# Patient Record
Sex: Female | Born: 2000 | Race: White | Hispanic: No | Marital: Single | Smoking: Never smoker
Health system: Southern US, Community
[De-identification: ages and names within clinical notes are randomized; demographics above are authoritative.]

## PROBLEM LIST (undated history)

## (undated) ENCOUNTER — Inpatient Hospital Stay (HOSPITAL_COMMUNITY): Payer: Self-pay

## (undated) DIAGNOSIS — F329 Major depressive disorder, single episode, unspecified: Secondary | ICD-10-CM

## (undated) DIAGNOSIS — F419 Anxiety disorder, unspecified: Secondary | ICD-10-CM

## (undated) DIAGNOSIS — F909 Attention-deficit hyperactivity disorder, unspecified type: Secondary | ICD-10-CM

## (undated) DIAGNOSIS — J302 Other seasonal allergic rhinitis: Secondary | ICD-10-CM

## (undated) DIAGNOSIS — R519 Headache, unspecified: Secondary | ICD-10-CM

## (undated) DIAGNOSIS — F32A Depression, unspecified: Secondary | ICD-10-CM

## (undated) DIAGNOSIS — R51 Headache: Secondary | ICD-10-CM

## (undated) DIAGNOSIS — A749 Chlamydial infection, unspecified: Secondary | ICD-10-CM

## (undated) HISTORY — DX: Anxiety disorder, unspecified: F41.9

## (undated) HISTORY — DX: Headache: R51

## (undated) HISTORY — DX: Headache, unspecified: R51.9

## (undated) HISTORY — DX: Attention-deficit hyperactivity disorder, unspecified type: F90.9

## (undated) HISTORY — DX: Other seasonal allergic rhinitis: J30.2

## (undated) HISTORY — DX: Depression, unspecified: F32.A

---
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
## 2022-01-05 DIAGNOSIS — Z419 Encounter for procedure for purposes other than remedying health state, unspecified: Secondary | ICD-10-CM | POA: Diagnosis not present

## 2022-01-09 ENCOUNTER — Ambulatory Visit
Admission: EM | Admit: 2022-01-09 | Discharge: 2022-01-09 | Disposition: A | Discharge status: Home / Self Care | Payer: Medicaid Other | Attending: Emergency Medicine | Admitting: Emergency Medicine

## 2022-01-09 ENCOUNTER — Encounter: Payer: Self-pay | Admitting: Emergency Medicine

## 2022-01-09 DIAGNOSIS — Z202 Contact with and (suspected) exposure to infections with a predominantly sexual mode of transmission: Secondary | ICD-10-CM | POA: Diagnosis not present

## 2022-01-09 DIAGNOSIS — Z113 Encounter for screening for infections with a predominantly sexual mode of transmission: Secondary | ICD-10-CM

## 2022-01-09 DIAGNOSIS — N898 Other specified noninflammatory disorders of vagina: Secondary | ICD-10-CM

## 2022-01-09 LAB — POCT URINALYSIS DIP (MANUAL ENTRY)
Bilirubin, UA: NEGATIVE
Glucose, UA: NEGATIVE mg/dL
Ketones, POC UA: NEGATIVE mg/dL
Nitrite, UA: NEGATIVE
Protein Ur, POC: NEGATIVE mg/dL
Spec Grav, UA: 1.02 (ref 1.010–1.025)
Urobilinogen, UA: 0.2 E.U./dL
pH, UA: 6 (ref 5.0–8.0)

## 2022-01-09 LAB — POCT URINE PREGNANCY: Preg Test, Ur: NEGATIVE

## 2022-01-09 MED ORDER — DOXYCYCLINE HYCLATE 100 MG PO CAPS: 100.0000 mg | ORAL_CAPSULE | Freq: Two times a day (BID) | ORAL | 0 refills | Status: AC

## 2022-01-09 NOTE — ED Provider Notes (Signed)
?[facility] ? ? ? ?CSN: [id number] ?Arrival date & time: 01/09/22  1423 ? ? ?  ? ?History   ?Chief Complaint ?Chief Complaint  ?Patient presents with  ? Exposure to STD  ?  Entered by patient  ? ? ?HPI ?Sarah Spears is a 21 y.o. female.  Patient presents with white vaginal discharge x2 days.  She states her boyfriend tested positive for chlamydia last night.  She denies fever, rash, abdominal pain, dysuria, flank pain, pelvic pain, or other symptoms.  No treatments at home.  Her medical history includes seasonal allergies, anxiety, depression, ADHD, bipolar 2 disorder. ? ?The history is provided by the patient and medical records.  ? ?Past Medical History:  ?Diagnosis Date  ? ADHD (attention deficit hyperactivity disorder)   ? Anxiety   ? COVID-19 2022  ? Depression   ? Headache   ? Seasonal allergies   ? ? ?Patient Active Problem List  ? Diagnosis Date Noted  ? Parent-child conflict 11/27/2019  ? Bipolar 2 disorder, major depressive episode (HCC) 10/30/2019  ? Overdose 11/29/2017  ? Severe recurrent major depression without psychotic features (HCC) 11/28/2017  ? ? ?Past Surgical History:  ?Procedure Laterality Date  ? REMOVAL OF IMPLANON ROD  07/08/2021  ? ? ?OB History   ?No obstetric history on file. ?  ? ? ? ?Home Medications   ? ?Prior to Admission medications   ?Medication Sig Start Date End Date Taking? Authorizing Provider  ?doxycycline (VIBRAMYCIN) 100 MG capsule Take 1 capsule (100 mg total) by mouth 2 (two) times daily for 7 days. 01/09/22 01/16/22 Yes Mickie Bail, NP  ?escitalopram (LEXAPRO) 10 MG tablet Take 1 tablet (10 mg total) by mouth daily. 11/21/20   Jesse Sans, MD  ?etonogestrel (NEXPLANON) 68 MG IMPL implant 1 each by Subdermal route once. 08/18/21   [provider]  ?hydrOXYzine (ATARAX/VISTARIL) 25 MG tablet Take 1 tablet (25 mg total) by mouth 3 (three) times daily as needed for anxiety. 11/20/20   Jesse Sans, MD  ?traZODone (DESYREL) 50 MG tablet Take 0.5  tablets (25 mg total) by mouth at bedtime as needed for sleep. 11/20/20   Jesse Sans, MD  ? ? ?Family History ?Family History  ?Problem Relation Age of Onset  ? Bipolar disorder Mother   ? Anxiety disorder Mother   ? Drug abuse Mother   ? Depression Mother   ? ADD / ADHD Mother   ? Seizures Sister   ? Bipolar disorder Maternal Aunt   ? Anxiety disorder Maternal Aunt   ? Depression Maternal Aunt   ? Alcohol abuse Maternal Aunt   ? Drug abuse Maternal Aunt   ? Post-traumatic stress disorder Maternal Aunt   ? Bipolar disorder Maternal Grandfather   ? Anxiety disorder Maternal Grandfather   ? Depression Maternal Grandfather   ? Post-traumatic stress disorder Maternal Grandmother   ? Depression Maternal Grandmother   ? Bipolar disorder Maternal Grandmother   ? Anxiety disorder Maternal Grandmother   ? ? ?Social History ?Social History  ? ?Tobacco Use  ? Smoking status: Never  ? Smokeless tobacco: Never  ?Vaping Use  ? Vaping Use: Never used  ?Substance Use Topics  ? Alcohol use: No  ? Drug use: No  ? ? ? ?Allergies   ?Lactose intolerance (gi) ? ? ?Review of Systems ?Review of Systems  ?Constitutional:  Negative for chills and fever.  ?Gastrointestinal:  Negative for abdominal pain.  ?Genitourinary:  Positive for vaginal discharge.  Negative for dysuria, flank pain, hematuria and pelvic pain.  ?Skin:  Negative for rash and wound.  ?All other systems reviewed and are negative. ? ? ?Physical Exam ?Triage Vital Signs ?ED Triage Vitals  ?Enc Vitals Group  ?   BP   ?   Pulse   ?   Resp   ?   Temp   ?   Temp src   ?   SpO2   ?   Weight   ?   Height   ?   Head Circumference   ?   Peak Flow   ?   Pain Score   ?   Pain Loc   ?   Pain Edu?   ?   Excl. in GC?   ? ?No data found. ? ?Updated Vital Signs ?BP 127/68   Pulse 95   Temp 98.2 ?F (36.8 ?C)   Resp 18   SpO2 96%  ? ?Visual Acuity ?Right Eye Distance:   ?Left Eye Distance:   ?Bilateral Distance:   ? ?Right Eye Near:   ?Left Eye Near:    ?Bilateral Near:    ? ?Physical  Exam ?Vitals and nursing note reviewed.  ?Constitutional:   ?   General: She is not in acute distress. ?   Appearance: Normal appearance. She is well-developed. She is not ill-appearing.  ?HENT:  ?   Mouth/Throat:  ?   Mouth: Mucous membranes are moist.  ?Cardiovascular:  ?   Rate and Rhythm: Normal rate and regular rhythm.  ?   Heart sounds: Normal heart sounds.  ?Pulmonary:  ?   Effort: Pulmonary effort is normal. No respiratory distress.  ?   Breath sounds: Normal breath sounds.  ?Abdominal:  ?   General: Bowel sounds are normal.  ?   Palpations: Abdomen is soft.  ?   Tenderness: There is no abdominal tenderness. There is no right CVA tenderness, left CVA tenderness, guarding or rebound.  ?Musculoskeletal:  ?   Cervical back: Neck supple.  ?Skin: ?   General: Skin is warm and dry.  ?Neurological:  ?   Mental Status: She is alert.  ?Psychiatric:     ?   Mood and Affect: Mood normal.     ?   Behavior: Behavior normal.  ? ? ? ?UC Treatments / Results  ?Labs ?(all labs ordered are listed, but only abnormal results are displayed) ?Labs Reviewed  ?POCT URINALYSIS DIP (MANUAL ENTRY) - Abnormal; Notable for the following components:  ?    Result Value  ? Blood, UA trace-lysed (*)   ? Leukocytes, UA Small (1+) (*)   ? All other components within normal limits  ?POCT URINE PREGNANCY  ?CERVICOVAGINAL ANCILLARY ONLY  ? ? ?EKG ? ? ?Radiology ?No results found. ? ?Procedures ?Procedures (including critical care time) ? ?Medications Ordered in UC ?Medications - No data to display ? ?Initial Impression / Assessment and Plan / UC Course  ?I have reviewed the triage vital signs and the nursing notes. ? ?Pertinent labs & imaging results that were available during my care of the patient were reviewed by me and considered in my medical decision making (see chart for details). ? ?  ?Exposure to chlamydia, vaginal discharge, screening for STDs.  Patient's boyfriend tested positive for chlamydia yesterday.  Patient obtained vaginal self  swab for testing.  Treating with doxycycline.  Discussed that we will call if test results are positive.  Discussed that she may require additional treatment at that time.  Discussed that sexual partner(s) may also require treatment.  Instructed patient to abstain from sexual activity for at least 7 days.  Instructed her to follow-up with her PCP or gynecologist if her symptoms are not improving.  Patient agrees to plan of care.  ? ?Final Clinical Impressions(s) / UC Diagnoses  ? ?Final diagnoses:  ?Exposure to chlamydia  ?Vaginal discharge  ?Screening for STD (sexually transmitted disease)  ? ? ? ?Discharge Instructions   ? ?  ?Take doxycycline twice a day for 7 days.   ? ?Your vaginal tests are pending.  If your test results are positive, we will call you.  You and your sexual partner(s) may require treatment at that time.  Do not have sexual activity for at least 7 days.   ? ? ? ? ? ? ?ED Prescriptions   ? ? Medication Sig Dispense Auth. Provider  ? doxycycline (VIBRAMYCIN) 100 MG capsule Take 1 capsule (100 mg total) by mouth 2 (two) times daily for 7 days. 14 capsule Mickie Bail, NP  ? ?  ? ?PDMP not reviewed this encounter. ?  ?Mickie Bail, NP ?01/09/22 1457 ? ?

## 2022-01-09 NOTE — Discharge Instructions (Addendum)
Take doxycycline twice a day for 7 days.    Your vaginal tests are pending.  If your test results are positive, we will call you.  You and your sexual partner(s) may require treatment at that time.  Do not have sexual activity for at least 7 days.     

## 2022-01-12 ENCOUNTER — Telehealth (HOSPITAL_COMMUNITY): Payer: Self-pay | Admitting: Emergency Medicine

## 2022-01-12 LAB — CERVICOVAGINAL ANCILLARY ONLY
Bacterial Vaginitis (gardnerella): NEGATIVE
Candida Glabrata: NEGATIVE
Candida Vaginitis: POSITIVE — AB
Chlamydia: POSITIVE — AB
Comment: NEGATIVE
Comment: NEGATIVE
Comment: NEGATIVE
Comment: NEGATIVE
Comment: NEGATIVE
Comment: NORMAL
Neisseria Gonorrhea: NEGATIVE
Trichomonas: NEGATIVE

## 2022-01-12 MED ORDER — METRONIDAZOLE 500 MG PO TABS: 500.0000 mg | ORAL_TABLET | Freq: Two times a day (BID) | ORAL | 0 refills | Status: DC

## 2022-01-13 ENCOUNTER — Telehealth (HOSPITAL_COMMUNITY): Payer: Self-pay | Admitting: Emergency Medicine

## 2022-01-13 MED ORDER — FLUCONAZOLE 150 MG PO TABS: 150.0000 mg | ORAL_TABLET | Freq: Once | ORAL | 0 refills | Status: AC

## 2022-02-05 DIAGNOSIS — Z419 Encounter for procedure for purposes other than remedying health state, unspecified: Secondary | ICD-10-CM | POA: Diagnosis not present

## 2022-02-06 ENCOUNTER — Encounter: Payer: Self-pay | Admitting: Obstetrics and Gynecology

## 2022-02-07 ENCOUNTER — Telehealth: Payer: Medicaid Other | Admitting: Family Medicine

## 2022-02-07 DIAGNOSIS — B3731 Acute candidiasis of vulva and vagina: Secondary | ICD-10-CM

## 2022-02-07 DIAGNOSIS — N898 Other specified noninflammatory disorders of vagina: Secondary | ICD-10-CM

## 2022-02-07 MED ORDER — FLUCONAZOLE 150 MG PO TABS: 150.0000 mg | ORAL_TABLET | Freq: Once | ORAL | 0 refills | Status: AC

## 2022-02-07 NOTE — Progress Notes (Signed)

## 2022-02-10 ENCOUNTER — Telehealth: Payer: Medicaid Other | Admitting: Physician Assistant

## 2022-02-10 ENCOUNTER — Ambulatory Visit: Payer: Medicaid Other

## 2022-02-10 DIAGNOSIS — A749 Chlamydial infection, unspecified: Secondary | ICD-10-CM

## 2022-02-10 NOTE — Progress Notes (Signed)
See other chart- she registered twice

## 2022-02-11 NOTE — Progress Notes (Signed)
Because of concern for residual STI, I feel your condition warrants further evaluation and I recommend that you be seen in a face to face visit. This is not something we can fully reassess via e-visit or video visit so I recommend either reaching out to your PCP, GYN or being seen at a local urgent care for evaluation.   NOTE: There will be NO CHARGE for this eVisit   If you are having a true medical emergency please call 911.      For an urgent face to face visit, [place] has six urgent care centers for your convenience:     [facility] at [facility] Directions [phone] [address] [place], [place] [address]    [facility]) Get Driving Directions [phone] [address] [place], [place] [address]  [facility] Center [facility] [place] [facility]) Get Driving Directions [phone] [address] [place],  [place]  [address]  [facility] at [facility] Get Driving Directions [phone] [address], [address] [place], [place] [address]   [facility] Health Urgent Care at [facility] Get Driving Directions  [phone] [address] [place], [place] [address]   [facility] Health Urgent Care at [facility] Directions [phone] [address] [place], [place] [address]  Your MyChart E-visit questionnaire answers were reviewed by a board certified advanced clinical practitioner to complete your personal care plan based on your specific symptoms.  Thank you for using e-Visits.

## 2022-02-13 ENCOUNTER — Ambulatory Visit: Admit: 2022-02-13 | Discharge status: Against Medical Advice | Payer: Medicaid Other

## 2022-02-13 ENCOUNTER — Emergency Department
Admission: EM | Admit: 2022-02-13 | Discharge: 2022-02-14 | Disposition: A | Discharge status: Home / Self Care | Payer: Medicaid Other | Attending: Emergency Medicine | Admitting: Emergency Medicine

## 2022-02-13 ENCOUNTER — Other Ambulatory Visit: Payer: Self-pay

## 2022-02-13 DIAGNOSIS — N390 Urinary tract infection, site not specified: Secondary | ICD-10-CM | POA: Diagnosis not present

## 2022-02-13 DIAGNOSIS — D72829 Elevated white blood cell count, unspecified: Secondary | ICD-10-CM | POA: Diagnosis not present

## 2022-02-13 DIAGNOSIS — R000 Tachycardia, unspecified: Secondary | ICD-10-CM | POA: Diagnosis not present

## 2022-02-13 DIAGNOSIS — R300 Dysuria: Secondary | ICD-10-CM | POA: Diagnosis present

## 2022-02-13 DIAGNOSIS — Z8616 Personal history of COVID-19: Secondary | ICD-10-CM | POA: Insufficient documentation

## 2022-02-13 LAB — COMPREHENSIVE METABOLIC PANEL
ALT: 13 U/L (ref 0–44)
AST: 18 U/L (ref 15–41)
Albumin: 4.3 g/dL (ref 3.5–5.0)
Alkaline Phosphatase: 55 U/L (ref 38–126)
Anion gap: 5 (ref 5–15)
BUN: 17 mg/dL (ref 6–20)
CO2: 26 mmol/L (ref 22–32)
Calcium: 8.9 mg/dL (ref 8.9–10.3)
Chloride: 107 mmol/L (ref 98–111)
Creatinine, Ser: 0.75 mg/dL (ref 0.44–1.00)
GFR, Estimated: >60 mL/min (ref >60)
Glucose, Bld: 128 mg/dL — ABNORMAL HIGH (ref 70–99)
Potassium: 3.4 mmol/L — ABNORMAL LOW (ref 3.5–5.1)
Sodium: 138 mmol/L (ref 135–145)
Total Bilirubin: 0.8 mg/dL (ref 0.3–1.2)
Total Protein: 7.3 g/dL (ref 6.5–8.1)

## 2022-02-13 LAB — URINALYSIS, ROUTINE W REFLEX MICROSCOPIC
Bilirubin Urine: NEGATIVE
Glucose, UA: NEGATIVE mg/dL
Ketones, ur: 20 mg/dL — AB
Nitrite: NEGATIVE
Protein, ur: 30 mg/dL — AB
RBC / HPF: >50 RBC/hpf — ABNORMAL HIGH (ref 0–5)
Specific Gravity, Urine: 1.019 (ref 1.005–1.030)
WBC, UA: >50 WBC/hpf — ABNORMAL HIGH (ref 0–5)
pH: 5 (ref 5.0–8.0)

## 2022-02-13 LAB — CBC
HCT: 37.7 % (ref 36.0–46.0)
Hemoglobin: 11.9 g/dL — ABNORMAL LOW (ref 12.0–15.0)
MCH: 27.4 pg (ref 26.0–34.0)
MCHC: 31.6 g/dL (ref 30.0–36.0)
MCV: 86.9 fL (ref 80.0–100.0)
Platelets: 331 10*3/uL (ref 150–400)
RBC: 4.34 MIL/uL (ref 3.87–5.11)
RDW: 14.7 % (ref 11.5–15.5)
WBC: 16.4 10*3/uL — ABNORMAL HIGH (ref 4.0–10.5)
nRBC: 0 % (ref 0.0–0.2)

## 2022-02-13 LAB — POC URINE PREG, ED: Preg Test, Ur: NEGATIVE

## 2022-02-13 MED ORDER — CEPHALEXIN 500 MG PO CAPS
500.0000 mg | ORAL_CAPSULE | Freq: Once | ORAL | Status: AC
  Administered 2022-02-14: 500 mg via ORAL
  Filled 2022-02-13: qty 1

## 2022-02-13 MED ORDER — CEPHALEXIN 500 MG PO CAPS: 500.0000 mg | ORAL_CAPSULE | Freq: Two times a day (BID) | ORAL | 0 refills | Status: DC

## 2022-02-13 MED ORDER — IBUPROFEN 800 MG PO TABS
800.0000 mg | ORAL_TABLET | Freq: Once | ORAL | Status: AC
  Administered 2022-02-14: 800 mg via ORAL
  Filled 2022-02-13: qty 1

## 2022-02-13 NOTE — Discharge Instructions (Addendum)
You may alternate Tylenol 1000 mg every 6 hours as needed for pain, fever and Ibuprofen 800 mg every 6-8 hours as needed for pain, fever.  Please take Ibuprofen with food.  Do not take more than 4000 mg of Tylenol (acetaminophen) in a 24 hour period. ° °

## 2022-02-13 NOTE — ED Provider Notes (Signed)
[facility] Provider Note    Event Date/Time   First MD Initiated Contact with Patient 02/13/22 2300     (approximate)   History   Hematuria   HPI  Sarah Spears is a 21 y.o. female with no significant past medical history who presents emergency department with 2 days of dysuria, hematuria, urinary frequency and urgency.  No abdominal pain, flank pain, fevers, vomiting.  No vaginal bleeding or discharge.  No previous history of UTIs.   History provided by patient and family.    Past Medical History:  Diagnosis Date   ADHD (attention deficit hyperactivity disorder)    Anxiety    COVID-19 2022   Depression    Headache    Seasonal allergies     Past Surgical History:  Procedure Laterality Date   REMOVAL OF IMPLANON ROD  07/08/2021    MEDICATIONS:  Prior to Admission medications   Medication Sig Start Date End Date Taking? Authorizing Provider  escitalopram (LEXAPRO) 10 MG tablet Take 1 tablet (10 mg total) by mouth daily. 11/21/20   Jesse Sans, MD  etonogestrel (NEXPLANON) 68 MG IMPL implant 1 each by Subdermal route once. 08/18/21   [provider]  hydrOXYzine (ATARAX/VISTARIL) 25 MG tablet Take 1 tablet (25 mg total) by mouth 3 (three) times daily as needed for anxiety. 11/20/20   Jesse Sans, MD  metroNIDAZOLE (FLAGYL) 500 MG tablet Take 1 tablet (500 mg total) by mouth 2 (two) times daily. 01/12/22   Merrilee Jansky, MD  traZODone (DESYREL) 50 MG tablet Take 0.5 tablets (25 mg total) by mouth at bedtime as needed for sleep. 11/20/20   Jesse Sans, MD    Physical Exam   Triage Vital Signs: ED Triage Vitals  Enc Vitals Group     BP 02/13/22 2057 132/72     Pulse Rate 02/13/22 2057 (!) 115     Resp 02/13/22 2057 15     Temp 02/13/22 2057 99.4 F (37.4 C)     Temp Source 02/13/22 2057 Oral     SpO2 02/13/22 2057 95 %     Weight 02/13/22 2056 143 lb (64.9 kg)     Height 02/13/22 2056 5\' 1"  (1.549 m)     Head  Circumference --      Peak Flow --      Pain Score 02/13/22 2105 3     Pain Loc --      Pain Edu? --      Excl. in GC? --     Most recent vital signs: Vitals:   02/13/22 2057 02/14/22 0020  BP: 132/72 122/66  Pulse: (!) 115 90  Resp: 15 18  Temp: 99.4 F (37.4 C) 99 F (37.2 C)  SpO2: 95% 100%    CONSTITUTIONAL: Alert and oriented and responds appropriately to questions. Well-appearing; well-nourished HEAD: Normocephalic, atraumatic EYES: Conjunctivae clear, pupils appear equal, sclera nonicteric ENT: normal nose; moist mucous membranes NECK: Supple, normal ROM CARD: RRR; S1 and S2 appreciated; no murmurs, no clicks, no rubs, no gallops RESP: Normal chest excursion without splinting or tachypnea; breath sounds clear and equal bilaterally; no wheezes, no rhonchi, no rales, no hypoxia or respiratory distress, speaking full sentences ABD/GI: Normal bowel sounds; non-distended; soft, non-tender, no rebound, no guarding, no peritoneal signs BACK: The back appears normal, no CVA tenderness EXT: Normal ROM in all joints; no deformity noted, no edema; no cyanosis SKIN: Normal color for age and race; warm; no rash on  exposed skin NEURO: Moves all extremities equally, normal speech PSYCH: The patient's mood and manner are appropriate.   ED Results / Procedures / Treatments   LABS: (all labs ordered are listed, but only abnormal results are displayed) Labs Reviewed  URINALYSIS, ROUTINE W REFLEX MICROSCOPIC - Abnormal; Notable for the following components:      Result Value   Color, Urine YELLOW (*)    APPearance CLOUDY (*)    Hgb urine dipstick LARGE (*)    Ketones, ur 20 (*)    Protein, ur 30 (*)    Leukocytes,Ua LARGE (*)    RBC / HPF >50 (*)    WBC, UA >50 (*)    Bacteria, UA RARE (*)    All other components within normal limits  COMPREHENSIVE METABOLIC PANEL - Abnormal; Notable for the following components:   Potassium 3.4 (*)    Glucose, Bld 128 (*)    All other  components within normal limits  CBC - Abnormal; Notable for the following components:   WBC 16.4 (*)    Hemoglobin 11.9 (*)    All other components within normal limits  URINE CULTURE  POC URINE PREG, ED     EKG:  RADIOLOGY: My personal review and interpretation of imaging:    I have personally reviewed all radiology reports.   No results found.   PROCEDURES:  Critical Care performed:      Procedures    IMPRESSION / MDM / ASSESSMENT AND PLAN / ED COURSE  I reviewed the triage vital signs and the nursing notes.    Patient here with complaints of 2 days of dysuria, urinary frequency and urgency, hematuria.     DIFFERENTIAL DIAGNOSIS (includes but not limited to):   UTI, kidney stone, pyelonephritis, STD, less likely appendicitis based on benign exam   Patient's presentation is most consistent with acute presentation with potential threat to life or bodily function.   PLAN: Patient here with complaints of urinary frequency, urgency, dysuria and hematuria.  Her abdominal exam is benign.  She is not having any flank pain.  She was tachycardic on arrival with low-grade temperature but is nontoxic in appearance.  Will obtain urinalysis, urine culture. We will obtain CBC, CMP.  No indication for imaging at this time.  MEDICATIONS GIVEN IN ED: Medications  cephALEXin (KEFLEX) capsule 500 mg (500 mg Oral Given 02/14/22 0014)  ibuprofen (ADVIL) tablet 800 mg (800 mg Oral Given 02/14/22 0014)     ED COURSE: Patient's labs do show leukocytosis of 16,000.  Normal electrolytes and renal function.  Pregnancy test negative.  Her urine does appear grossly infected.  I added on a urine culture.  We will give Keflex here.  She is well-appearing, tolerating p.o. with benign abdominal exam.  Repeat vitals show that her heart rate has improved and temperature is 99.  Recommended Tylenol, Motrin at home, increase fluid intake and will discharge with Rocephin for UTI.  Low suspicion for  pyelonephritis based on exam.  I feel she is safe for discharge home.   CONSULTS: Admission considered given leukocytosis, tachycardia but patient is young, healthy, well-appearing, nontoxic.  I feel she is safe for treatment for her UTI as an outpatient.   OUTSIDE RECORDS REVIEWED: Reviewed patient's recent family medicine notes on 02/07/2022 and 02/10/2022.  She has completed a course of doxycycline.  Patient tested positive for chlamydia on 02/06/2022.       FINAL CLINICAL IMPRESSION(S) / ED DIAGNOSES   Final diagnoses:  Acute UTI  Rx / DC Orders   ED Discharge Orders          Ordered    cephALEXin (KEFLEX) 500 MG capsule  2 times daily        02/13/22 2334             Note:  This document was prepared using Dragon voice recognition software and may include unintentional dictation errors.   Khori Rosevear, Layla MawKristen N, DO 02/14/22 [phone]

## 2022-02-13 NOTE — ED Triage Notes (Signed)
Pt states has been having blood in urine, lower abd pain. Pt states she tested positive for chlamydia a month ago. Pt states has had hematuria for a couple of days. Pt appears in no acute distress.

## 2022-02-15 ENCOUNTER — Emergency Department (HOSPITAL_BASED_OUTPATIENT_CLINIC_OR_DEPARTMENT_OTHER)
Admission: EM | Admit: 2022-02-15 | Discharge: 2022-02-15 | Disposition: A | Discharge status: Home / Self Care | Payer: Medicaid Other | Attending: Emergency Medicine | Admitting: Emergency Medicine

## 2022-02-15 ENCOUNTER — Encounter (HOSPITAL_BASED_OUTPATIENT_CLINIC_OR_DEPARTMENT_OTHER): Payer: Self-pay | Admitting: Obstetrics and Gynecology

## 2022-02-15 ENCOUNTER — Other Ambulatory Visit: Payer: Self-pay

## 2022-02-15 DIAGNOSIS — Z202 Contact with and (suspected) exposure to infections with a predominantly sexual mode of transmission: Secondary | ICD-10-CM | POA: Diagnosis present

## 2022-02-15 DIAGNOSIS — N390 Urinary tract infection, site not specified: Secondary | ICD-10-CM | POA: Diagnosis not present

## 2022-02-15 DIAGNOSIS — B3731 Acute candidiasis of vulva and vagina: Secondary | ICD-10-CM | POA: Insufficient documentation

## 2022-02-15 DIAGNOSIS — R319 Hematuria, unspecified: Secondary | ICD-10-CM | POA: Diagnosis not present

## 2022-02-15 DIAGNOSIS — D72829 Elevated white blood cell count, unspecified: Secondary | ICD-10-CM | POA: Diagnosis not present

## 2022-02-15 HISTORY — DX: Chlamydial infection, unspecified: A74.9

## 2022-02-15 LAB — URINALYSIS, ROUTINE W REFLEX MICROSCOPIC
Bilirubin Urine: NEGATIVE
Glucose, UA: NEGATIVE mg/dL
Ketones, ur: NEGATIVE mg/dL
Nitrite: NEGATIVE
Protein, ur: NEGATIVE mg/dL
Specific Gravity, Urine: 1.02 (ref 1.005–1.030)
pH: 6 (ref 5.0–8.0)

## 2022-02-15 LAB — PREGNANCY, URINE: Preg Test, Ur: NEGATIVE

## 2022-02-15 LAB — BASIC METABOLIC PANEL
Anion gap: 10 (ref 5–15)
BUN: 18 mg/dL (ref 6–20)
CO2: 25 mmol/L (ref 22–32)
Calcium: 9.7 mg/dL (ref 8.9–10.3)
Chloride: 102 mmol/L (ref 98–111)
Creatinine, Ser: 0.72 mg/dL (ref 0.44–1.00)
GFR, Estimated: >60 mL/min (ref >60)
Glucose, Bld: 87 mg/dL (ref 70–99)
Potassium: 3.7 mmol/L (ref 3.5–5.1)
Sodium: 137 mmol/L (ref 135–145)

## 2022-02-15 LAB — WET PREP, GENITAL
Clue Cells Wet Prep HPF POC: NONE SEEN
Sperm: NONE SEEN
Trich, Wet Prep: NONE SEEN
WBC, Wet Prep HPF POC: >=10 — AB (ref <10)

## 2022-02-15 LAB — CBC
HCT: 34.5 % — ABNORMAL LOW (ref 36.0–46.0)
Hemoglobin: 11.2 g/dL — ABNORMAL LOW (ref 12.0–15.0)
MCH: 27.7 pg (ref 26.0–34.0)
MCHC: 32.5 g/dL (ref 30.0–36.0)
MCV: 85.4 fL (ref 80.0–100.0)
Platelets: 290 10*3/uL (ref 150–400)
RBC: 4.04 MIL/uL (ref 3.87–5.11)
RDW: 14.8 % (ref 11.5–15.5)
WBC: 12.3 10*3/uL — ABNORMAL HIGH (ref 4.0–10.5)
nRBC: 0 % (ref 0.0–0.2)

## 2022-02-15 MED ORDER — FLUCONAZOLE 200 MG PO TABS: 200.0000 mg | ORAL_TABLET | Freq: Once | ORAL | 0 refills | Status: AC

## 2022-02-15 MED ORDER — FLUCONAZOLE 150 MG PO TABS
150.0000 mg | ORAL_TABLET | Freq: Once | ORAL | Status: AC
  Administered 2022-02-15: 150 mg via ORAL
  Filled 2022-02-15: qty 1

## 2022-02-15 MED ORDER — FLUCONAZOLE 200 MG PO TABS: 200.0000 mg | ORAL_TABLET | Freq: Every day | ORAL | 0 refills | Status: DC

## 2022-02-15 NOTE — ED Provider Notes (Signed)
[facility] EMERGENCY DEPT Provider Note   CSN: [id number] Arrival date & time: 02/15/22  1744     History  Chief Complaint  Patient presents with   Exposure to STD    Sarah Spears is a 21 y.o. female with chief complaint of continued urinary symptoms and STD exposure.  Was treated 1 month ago for chlamydia with doxycycline, so was her partner.  States she waited 2 days after finishing her initial antibiotics before having intercourse with the same partner as before, and redeveloped symptoms.  Return to the ED, was diagnosed with a UTI, and prescribed Keflex.  Patient states she believes she has chlamydia again or that it never resolved.  Endorses dysuria, suprapubic tenderness, white thick discharge, and increased urinary frequency.  Denies fever, chills, RLQ or LLQ abdominal pain, flank pain, N/V/D, neck stiffness, vaginal bleeding, vaginal pain, dyspareunia, or malodorous discharge.    The history is provided by the patient and medical records.  Exposure to STD       Home Medications Prior to Admission medications   Medication Sig Start Date End Date Taking? Authorizing Provider  cephALEXin (KEFLEX) 500 MG capsule Take 1 capsule (500 mg total) by mouth 2 (two) times daily. 02/13/22   Ward, Layla Maw, DO  escitalopram (LEXAPRO) 10 MG tablet Take 1 tablet (10 mg total) by mouth daily. 11/21/20   Jesse Sans, MD  etonogestrel (NEXPLANON) 68 MG IMPL implant 1 each by Subdermal route once. 08/18/21   [provider]  fluconazole (DIFLUCAN) 200 MG tablet Take 1 tablet (200 mg total) by mouth once for 1 dose. 02/15/22 02/15/22  Cecil Cobbs, PA-C  hydrOXYzine (ATARAX/VISTARIL) 25 MG tablet Take 1 tablet (25 mg total) by mouth 3 (three) times daily as needed for anxiety. 11/20/20   Jesse Sans, MD  metroNIDAZOLE (FLAGYL) 500 MG tablet Take 1 tablet (500 mg total) by mouth 2 (two) times daily. 01/12/22   Merrilee Jansky, MD  traZODone (DESYREL) 50 MG  tablet Take 0.5 tablets (25 mg total) by mouth at bedtime as needed for sleep. 11/20/20   Jesse Sans, MD      Allergies    Lactose intolerance (gi)    Review of Systems   Review of Systems  Physical Exam Updated Vital Signs BP 115/72   Pulse 92   Temp 99.1 F (37.3 C)   Resp 18   Ht  (1.549 m)   Wt 64.9 kg   LMP 01/20/2022 (Exact Date)   SpO2 100%   BMI 27.02 kg/m  Physical Exam Vitals and nursing note reviewed.  Constitutional:      General: She is not in acute distress.    Appearance: Normal appearance. She is well-developed. She is not ill-appearing, toxic-appearing or diaphoretic.     Comments: Afebrile  HENT:     Head: Normocephalic and atraumatic.     Mouth/Throat:     Mouth: Mucous membranes are moist.     Pharynx: Oropharynx is clear.  Eyes:     Conjunctiva/sclera: Conjunctivae normal.  Neck:     Comments: Neck very supple on exam Cardiovascular:     Rate and Rhythm: Normal rate and regular rhythm.     Pulses: Normal pulses.     Heart sounds: Normal heart sounds. No murmur heard. Pulmonary:     Effort: Pulmonary effort is normal. No respiratory distress.     Breath sounds: Normal breath sounds.  Abdominal:     General: Abdomen is flat.  Bowel sounds are normal. There is no distension.     Palpations: Abdomen is soft.     Tenderness: There is abdominal tenderness in the suprapubic area.     Comments: Mild suprapubic tenderness.  No flank pain.  Without guarding, distention, rebound, or obvious mass.  Genitourinary:    Comments: Patient deferred Musculoskeletal:        General: No swelling.     Cervical back: Neck supple. No rigidity.  Skin:    General: Skin is warm and dry.     Capillary Refill: Capillary refill takes less than 2 seconds.  Neurological:     Mental Status: She is alert and oriented to person, place, and time.  Psychiatric:        Mood and Affect: Mood normal.     ED Results / Procedures / Treatments   Labs (all labs  ordered are listed, but only abnormal results are displayed) Labs Reviewed  WET PREP, GENITAL - Abnormal; Notable for the following components:      Result Value   Yeast Wet Prep HPF POC PRESENT (*)    WBC, Wet Prep HPF POC >=10 (*)    All other components within normal limits  URINALYSIS, ROUTINE W REFLEX MICROSCOPIC - Abnormal; Notable for the following components:   Hgb urine dipstick TRACE (*)    Leukocytes,Ua LARGE (*)    Bacteria, UA RARE (*)    All other components within normal limits  CBC - Abnormal; Notable for the following components:   WBC 12.3 (*)    Hemoglobin 11.2 (*)    HCT 34.5 (*)    All other components within normal limits  URINE CULTURE  BASIC METABOLIC PANEL  PREGNANCY, URINE  RPR  HIV ANTIBODY (ROUTINE TESTING W REFLEX)  GC/CHLAMYDIA PROBE AMP ([place]) NOT AT [facility]    EKG None  Radiology No results found.  Procedures Procedures    Medications Ordered in ED Medications  fluconazole (DIFLUCAN) tablet 150 mg (150 mg Oral Given 02/15/22 2242)    ED Course/ Medical Decision Making/ A&P                           Medical Decision Making Amount and/or Complexity of Data Reviewed External Data Reviewed: notes. Labs: ordered. Decision-making details documented in ED Course. Radiology: ordered and independent interpretation performed. Decision-making details documented in ED Course. ECG/medicine tests: ordered and independent interpretation performed. Decision-making details documented in ED Course.  Risk OTC drugs. Prescription drug management.   21 y.o. female presents to the ED for concern of Exposure to STD   This involves an extensive number of treatment options, and is a complaint that carries with it a high risk of complications and morbidity.  The emergent differential diagnosis prior to evaluation includes, but is not limited to: UTI, STD, acute cystitis, pyelonephritis, renal calculi, vulvovaginitis, PID, TOA, ovarian torsion   This  is not an exhaustive differential.   Past Medical History / Co-morbidities / Social History: Recent diagnosis of vaginal chlamydia infection and associated course of antibiotics.  Also recent diagnosis of UTI, currently on Keflex  Additional History:  Internal and external records from outside source obtained and reviewed including urgent care, ED visits  Physical Exam: Physical exam performed. The pertinent findings include: Mild abdominal suprapubic tenderness.  GU exam deferred by patient.  Lab Tests: I ordered, and personally interpreted labs.  The pertinent results include:   CBC: Mild elevated WBC 12.3, which is  an improvement since value of 16.42 days ago.  Consistently mild anemia 11.2 Hgb and 34.5 HCT.  Remaining values overall unremarkable. CMP/BMP: Unremarkable Urine pregnancy: Negative Urinalysis: Overall improved from values 2 days ago with significant differences of -- improved Hgb urine from large to trace, ketones and protein now negative, RBC and WBC values decreased, and appearance improved from cloudy to clear. Wet prep: Positive for yeast, WBC greater than 10, negative for trichomonas and clue cells HIV antibody: Pending RPR: Pending GC/chlamydia: Pending Urine culture: Pending  Imaging Studies: None  Medications: I have reviewed the patients home medicines and have made adjustments as needed  ED Course/Disposition: Pt well-appearing on exam.  Presenting with continued abdominal pain and white vaginal discharge.  Diagnosed 1 month ago with vaginal chlamydia infection, prescribed doxycycline.  Partner was also treated.  Restarted sexual activity with same partner.  Developed similar symptoms, but stated the discharge was thicker and causing very mild spots of bleeding.  The complaining of dysuria.  Seen a few days ago, prescribed Keflex for UTI.  Is concerned that she still has symptoms and of this change of vaginal discharge.  Patient deferred pelvic/GU exam.     Urinalysis and labs suggest improving renal function and evidence of resolving but still present UTI.  Patient currently on Keflex.  Wet prep indicates yeast, negative for BV or Trichomonas.  Gonorrhea, chlamydia, RPR, HIV pending.  Afebrile.  Patient without flank tenderness, or lab or clinical evidence of pyelonephritis or renal calculi.  Low suspicion for ovarian torsion or TOA, patient is afebrile and clinical picture does not correlate with these.  Low suspicion for PID at this time.  Urine pregnancy negative.  Based on labs and clinical picture, concerned pt has likely developed candidal vulvovaginitis following a course of antibiotics.  I also believe the Keflex appears to be treating the UTI successfully.  Urine culture pending.  Plan to treat for candidal and bacterial infection simultaneously.  Provided one-time dose of fluconazole in the ED, also sent an additional dose to the patient's pharmacy should candidal symptoms linger longer than 3 days.  Recommended continuation of Keflex with addition of probiotic.  Recommended close follow-up with PCP.  Since patient is sexually active, also recommended establishing care with an OB/GYN, provided resources.  Patient satisfied with today's encounter and wishes to go home.  patient in NAD and in good condition at time of discharge.  After consideration of the diagnostic results and the patient's encounter today, I feel that the emergency department workup does not suggest an emergent condition requiring admission or immediate intervention beyond what has been performed at this time.  The patient is safe for discharge and has been instructed to return immediately for worsening symptoms, change in symptoms or any other concerns.  Discussed course of treatment thoroughly with the patient, whom demonstrated understanding.  Patient in agreement and has no further questions.  I discussed this case with my attending physician Dr. Anitra LauthPlunkett, who agreed with the  proposed treatment course and cosigned this note including patient's presenting symptoms, physical exam, and planned diagnostics and interventions.  Attending physician stated agreement with plan or made changes to plan which were implemented.     This chart was dictated using voice recognition software.  Despite best efforts to proofread, errors can occur which can change the documentation meaning.         Final Clinical Impression(s) / ED Diagnoses Final diagnoses:  Vulvovaginal candidiasis  Urinary tract infection with hematuria, site unspecified  Rx / DC Orders ED Discharge Orders          Ordered    fluconazole (DIFLUCAN) 200 MG tablet   Once        02/15/22 2234              Sandrea Hammond 02/18/22 1211    Gwyneth Sprout, MD 02/19/22 1148

## 2022-02-15 NOTE — ED Triage Notes (Signed)
Patient reports to the ER for STD testing. Reports she has been exposed to Chlamydia.

## 2022-02-15 NOTE — Discharge Instructions (Addendum)
You have been provided a one-time treatment for yeast infection in the ED today.  A second single dose of fluconazole (the antifungal medication) has been sent to your pharmacy.  You may take this 72 hours after your dose here in the ED if you are still moderately symptomatic.  Continue taking your Keflex/cephalexin antibiotic as prescribed.  You may also take ibuprofen or Tylenol for additional pain management.  A few tests are still pending.  If the gonorrhea or chlamydia results are positive, if the HIV or RPR results are positive, or if the urine culture indicates the antibiotic you are currently taking is not optimally effective, you will receive a phone call with further instruction.    You have also been provided the contact information for local OB/GYN office.  Please call to set up an appointment and establish care.  Return to the ED for new or worsening symptoms as discussed.

## 2022-02-16 ENCOUNTER — Telehealth: Payer: Self-pay

## 2022-02-16 ENCOUNTER — Ambulatory Visit: Payer: Self-pay

## 2022-02-16 LAB — URINE CULTURE: Culture: >=100000 — AB

## 2022-02-16 LAB — HIV ANTIBODY (ROUTINE TESTING W REFLEX): HIV Screen 4th Generation wRfx: NONREACTIVE

## 2022-02-16 LAB — RPR: RPR Ser Ql: NONREACTIVE

## 2022-02-16 NOTE — Telephone Encounter (Signed)
Transition Care Management Follow-up Telephone Call Date of discharge and from where: 02/15/2022 from [place] How have you been since you were released from the hospital? Patient stated that she is feeling better and did not have any questions or concerns at this time.  Any questions or concerns? No  Items Reviewed: Did the pt receive and understand the discharge instructions provided? Yes  Medications obtained and verified? Yes  Other? No  Any new allergies since your discharge? No  Dietary orders reviewed? No Do you have support at home? Yes   Functional Questionnaire: (I = Independent and D = Dependent) ADLs: I  Bathing/Dressing- I  Meal Prep- I  Eating- I  Maintaining continence- I  Transferring/Ambulation- I  Managing Meds- I   Follow up appointments reviewed:  PCP Hospital f/u appt confirmed? No   Specialist Hospital f/u appt confirmed? Yes  Scheduled to see OBGYN on 03/12/2022 @ 3:10pm. Are transportation arrangements needed? No  If their condition worsens, is the pt aware to call PCP or go to the Emergency Dept.? Yes Was the patient provided with contact information for the PCP's office or ED? Yes Was to pt encouraged to call back with questions or concerns? Yes

## 2022-02-17 LAB — URINE CULTURE: Culture: 10000 — AB

## 2022-02-17 LAB — GC/CHLAMYDIA PROBE AMP (~~LOC~~) NOT AT ARMC
Chlamydia: NEGATIVE
Comment: NEGATIVE
Comment: NORMAL
Neisseria Gonorrhea: NEGATIVE

## 2022-02-19 ENCOUNTER — Telehealth: Payer: Self-pay | Admitting: *Deleted

## 2022-02-19 NOTE — Telephone Encounter (Signed)
Post ED Visit - Positive Culture Follow-up  Culture report reviewed by antimicrobial stewardship pharmacist: [facility] []  , Pharm.D. []  Enzo Bi, Pharm.D., BCPS AQ-ID []  , Pharm.D., BCPS []  Celedonio Miyamoto, Pharm.D., BCPS []  [place], Garvin Fila.D., BCPS, AAHIVP []  , Pharm.D., BCPS, AAHIVP [x]  Georgina Pillion, PharmD, BCPS []  , PharmD, BCPS []  [place], PharmD, BCPS []  [address], PharmD []  , PharmD, BCPS []  Estella Husk, PharmD  Pharmacy Team []  Lysle Pearl, PharmD []  , PharmD []  Phillips Climes, PharmD []  , Rph []  Agapito Games) , PharmD []  Verlan Friends, PharmD []  , PharmD []  Mervyn Gay, PharmD []  , PharmD []  Vinnie Level, PharmD []  [facility], PharmD []  , PharmD []  Len Childs, PharmD   Positive urine culture Treated with Fluconazole, organism sensitive to the same and no further patient follow-up is required at this time.    [place] 02/19/2022, 8:42 AM

## 2022-03-06 ENCOUNTER — Telehealth: Payer: Medicaid Other | Admitting: Nurse Practitioner

## 2022-03-06 DIAGNOSIS — N3000 Acute cystitis without hematuria: Secondary | ICD-10-CM | POA: Diagnosis not present

## 2022-03-07 DIAGNOSIS — Z419 Encounter for procedure for purposes other than remedying health state, unspecified: Secondary | ICD-10-CM | POA: Diagnosis not present

## 2022-03-07 MED ORDER — NITROFURANTOIN MONOHYD MACRO 100 MG PO CAPS: 100.0000 mg | ORAL_CAPSULE | Freq: Two times a day (BID) | ORAL | 0 refills | Status: AC

## 2022-03-07 NOTE — Progress Notes (Signed)
E-Visit for Urinary Problems  We are sorry that you are not feeling well.  Here is how we plan to help!  Based on what you shared with me it looks like you most likely have a simple urinary tract infection.  A UTI (Urinary Tract Infection) is a bacterial infection of the bladder.  Most cases of urinary tract infections are simple to treat but a key part of your care is to encourage you to drink plenty of fluids and watch your symptoms carefully.  I have prescribed MacroBid 100 mg twice a day for 5 days.  Your symptoms should gradually improve. Call us if the burning in your urine worsens, you develop worsening fever, back pain or pelvic pain or if your symptoms do not resolve after completing the antibiotic.  Urinary tract infections can be prevented by drinking plenty of water to keep your body hydrated.  Also be sure when you wipe, wipe from front to back and don't hold it in!  If possible, empty your bladder every 4 hours.  HOME CARE Drink plenty of fluids Compete the full course of the antibiotics even if the symptoms resolve Remember, when you need to go.go. Holding in your urine can increase the likelihood of getting a UTI! GET HELP RIGHT AWAY IF: You cannot urinate You get a high fever Worsening back pain occurs You see blood in your urine You feel sick to your stomach or throw up You feel like you are going to pass out  MAKE SURE YOU  Understand these instructions. Will watch your condition. Will get help right away if you are not doing well or get worse.   Thank you for choosing an e-visit.  Your e-visit answers were reviewed by a board certified advanced clinical practitioner to complete your personal care plan. Depending upon the condition, your plan could have included both over the counter or prescription medications.  Please review your pharmacy choice. Make sure the pharmacy is open so you can pick up prescription now. If there is a problem, you may contact your  provider through MyChart messaging and have the prescription routed to another pharmacy.  Your safety is important to us. If you have drug allergies check your prescription carefully.   For the next 24 hours you can use MyChart to ask questions about today's visit, request a non-urgent call back, or ask for a work or school excuse. You will get an email in the next two days asking about your experience. I hope that your e-visit has been valuable and will speed your recovery.   I spent approximately 5 minutes reviewing the patient's history, current symptoms and coordinating their plan of care today.   Meds ordered this encounter  Medications   nitrofurantoin, macrocrystal-monohydrate, (MACROBID) 100 MG capsule    Sig: Take 1 capsule (100 mg total) by mouth 2 (two) times daily for 5 days.    Dispense:  10 capsule    Refill:  0    

## 2022-03-12 ENCOUNTER — Ambulatory Visit: Payer: Medicaid Other | Admitting: Obstetrics and Gynecology

## 2022-03-17 ENCOUNTER — Telehealth: Payer: Medicaid Other | Admitting: Physician Assistant

## 2022-03-17 DIAGNOSIS — N941 Unspecified dyspareunia: Secondary | ICD-10-CM

## 2022-03-17 NOTE — Progress Notes (Signed)
Because of belly pain and pain on intercourse associated with discharge, I feel your condition warrants further evaluation and I recommend that you be seen in a face to face visit.   NOTE: There will be NO CHARGE for this eVisit   If you are having a true medical emergency please call 911.      For an urgent face to face visit, [place] has seven urgent care centers for your convenience:     [facility] Health Urgent Care Center at [facility] Directions [phone] [address] [place], [place] [address]    [facility] Health Urgent Care Center [facility]) Get Driving Directions [phone] [address] [place], [place] [address]  [facility] Health Urgent Care Center [facility]) Get Driving Directions [phone] [address] [place],  [place]  [address]  [facility] Health Urgent Care Center [facility] - at [employer] Directions  [phone] [address] [place],  [place] [address]   [facility] Health Urgent Care at [facility] Get Driving Directions [phone] [address], [address] [place], [place] [address]   [facility] Health Urgent Care at [facility] Get Driving Directions  [phone] [address] [place], [place] [address]   [facility] Health Urgent Care at [facility] Directions [phone] [address] [place], [place] [address]  Your MyChart E-visit questionnaire answers were reviewed by a board certified advanced clinical practitioner to complete your personal care plan based on your specific symptoms.  Thank you for using e-Visits.

## 2022-03-19 ENCOUNTER — Encounter: Payer: Self-pay | Admitting: Advanced Practice Midwife

## 2022-03-19 ENCOUNTER — Other Ambulatory Visit (HOSPITAL_COMMUNITY)
Admission: RE | Admit: 2022-03-19 | Discharge: 2022-03-19 | Disposition: A | Discharge status: Home / Self Care | Payer: Medicaid Other | Source: Ambulatory Visit | Attending: Advanced Practice Midwife | Admitting: Advanced Practice Midwife

## 2022-03-19 ENCOUNTER — Ambulatory Visit (INDEPENDENT_AMBULATORY_CARE_PROVIDER_SITE_OTHER): Payer: Medicaid Other | Admitting: Advanced Practice Midwife

## 2022-03-19 VITALS — BP 123/76 | HR 94 | Wt 143.0 lb

## 2022-03-19 DIAGNOSIS — Z202 Contact with and (suspected) exposure to infections with a predominantly sexual mode of transmission: Secondary | ICD-10-CM | POA: Diagnosis not present

## 2022-03-19 DIAGNOSIS — Z975 Presence of (intrauterine) contraceptive device: Secondary | ICD-10-CM | POA: Diagnosis not present

## 2022-03-19 DIAGNOSIS — N898 Other specified noninflammatory disorders of vagina: Secondary | ICD-10-CM | POA: Diagnosis not present

## 2022-03-19 DIAGNOSIS — R319 Hematuria, unspecified: Secondary | ICD-10-CM

## 2022-03-19 DIAGNOSIS — Z87440 Personal history of urinary (tract) infections: Secondary | ICD-10-CM | POA: Diagnosis not present

## 2022-03-19 DIAGNOSIS — N941 Unspecified dyspareunia: Secondary | ICD-10-CM

## 2022-03-19 DIAGNOSIS — Z8619 Personal history of other infectious and parasitic diseases: Secondary | ICD-10-CM | POA: Diagnosis not present

## 2022-03-19 NOTE — Progress Notes (Signed)
Teen pt here with complaints of vaginal odor, vaginal spotting with wiping , white thick discharge, pain w/ intercourse x 3-4 days now.  Pt reports having + CT 05/05/23this past May and UTI.   Contraception: Nexplanon   Pt is ok with doing self swab.

## 2022-03-20 DIAGNOSIS — N941 Unspecified dyspareunia: Secondary | ICD-10-CM

## 2022-03-20 DIAGNOSIS — Z202 Contact with and (suspected) exposure to infections with a predominantly sexual mode of transmission: Secondary | ICD-10-CM | POA: Insufficient documentation

## 2022-03-20 HISTORY — DX: Unspecified dyspareunia: N94.10

## 2022-03-20 LAB — CERVICOVAGINAL ANCILLARY ONLY
Chlamydia: NEGATIVE
Comment: NEGATIVE
Comment: NEGATIVE
Comment: NORMAL
Neisseria Gonorrhea: NEGATIVE
Trichomonas: NEGATIVE

## 2022-03-20 LAB — URINE CULTURE: Organism ID, Bacteria: NO GROWTH

## 2022-03-20 NOTE — Progress Notes (Signed)
GYNECOLOGY PROBLEM CARE ENCOUNTER NOTE  History:     Sarah Spears is a 21 y.o. G0P0000 female here for a gynecologic problem visit.  Current complaints: abnormal vaginal discharge, painful intercourse and vaginal spotting s/p treatment for Chlamydia, UTI .   Denies abnormal vaginal bleeding, discharge, pelvic pain, or other gynecologic concerns.   Patient reports completing her prescribed treatments for the aforementioned diagnoses. She reports 1 lifetime partner, onset of their sexual relationship was around April 2023. She reports they are still sexually active and do not consistently use condoms. She is aware that he may still be having sexual intercourse with at least one other person. Patient reports painful intercourse with this partner but states it has always been at least uncomfortable. She feels safe telling her partner to stop if necessary and he stops right away. She denies SI, HI, IPV.   Gynecologic History No LMP recorded. Contraception: Nexplanon Last Pap: N/A age 28.   Obstetric History OB History  Gravida Para Term Preterm AB Living  0 0 0 0 0 0  SAB IAB Ectopic Multiple Live Births  0 0 0 0 0    Past Medical History:  Diagnosis Date   ADHD (attention deficit hyperactivity disorder)    Anxiety    Chlamydia    COVID-19 2022   Depression    Headache    Seasonal allergies     Past Surgical History:  Procedure Laterality Date   REMOVAL OF IMPLANON ROD  07/08/2021    Current Outpatient Medications on File Prior to Visit  Medication Sig Dispense Refill   etonogestrel (NEXPLANON) 68 MG IMPL implant 1 each by Subdermal route once.     escitalopram (LEXAPRO) 10 MG tablet Take 1 tablet (10 mg total) by mouth daily. (Patient not taking: Reported on 03/19/2022) 30 tablet 1   hydrOXYzine (ATARAX/VISTARIL) 25 MG tablet Take 1 tablet (25 mg total) by mouth 3 (three) times daily as needed for anxiety. (Patient not taking: Reported on 03/19/2022) 90 tablet 1    metroNIDAZOLE (FLAGYL) 500 MG tablet Take 1 tablet (500 mg total) by mouth 2 (two) times daily. (Patient not taking: Reported on 03/19/2022) 14 tablet 0   traZODone (DESYREL) 50 MG tablet Take 0.5 tablets (25 mg total) by mouth at bedtime as needed for sleep. (Patient not taking: Reported on 03/19/2022) 15 tablet 0   No current facility-administered medications on file prior to visit.    Allergies  Allergen Reactions   Lactose Intolerance (Gi)     Social History:  reports that she has never smoked. She has been exposed to tobacco smoke. She has never used smokeless tobacco. She reports that she does not drink alcohol and does not use drugs.  Family History  Problem Relation Age of Onset   Bipolar disorder Mother    Anxiety disorder Mother    Drug abuse Mother    Depression Mother    ADD / ADHD Mother    Seizures Sister    Bipolar disorder Maternal Aunt    Anxiety disorder Maternal Aunt    Depression Maternal Aunt    Alcohol abuse Maternal Aunt    Drug abuse Maternal Aunt    Post-traumatic stress disorder Maternal Aunt    Bipolar disorder Maternal Grandfather    Anxiety disorder Maternal Grandfather    Depression Maternal Grandfather    Post-traumatic stress disorder Maternal Grandmother    Depression Maternal Grandmother    Bipolar disorder Maternal Grandmother    Anxiety disorder Maternal Grandmother  The following portions of the patient's history were reviewed and updated as appropriate: allergies, current medications, past family history, past medical history, past social history, past surgical history and problem list.  Review of Systems Pertinent items noted in HPI and remainder of comprehensive ROS otherwise negative.  Physical Exam:  BP 123/76   Pulse 94   Wt 143 lb (64.9 kg)   BMI 27.02 kg/m  CONSTITUTIONAL: Well-developed, well-nourished female in no acute distress.  HENT:  Normocephalic, atraumatic, External right and left ear normal.  EYES: Conjunctivae  and EOM are normal. Pupils are equal, round, and reactive to light. No scleral icterus.  SKIN: Skin is warm and dry. No rash noted. Not diaphoretic. No erythema. No pallor. MUSCULOSKELETAL: Normal range of motion. No tenderness.  No cyanosis, clubbing, or edema. NEUROLOGIC: Alert and oriented to person, place, and time. Normal reflexes, muscle tone coordination.  PSYCHIATRIC: Normal mood and affect. Normal behavior. Normal judgment and thought content. CARDIOVASCULAR: Normal heart rate noted, regular rhythm RESPIRATORY: Effort normal, no problems with respiration noted. BREASTS: Deferred ABDOMEN: Soft, no distention noted.  No tenderness, rebound or guarding.  PELVIC: Deferred per patient preference, pending CV swab results  No CVAT assessed   Assessment and Plan:    1. Vaginal discharge - Discussed likelihood of rebound yeast s/p antibiotics - Cervicovaginal ancillary only  2. Dyspareunia in female - Recurrent, likely worsened by Chlamydia - Discussed importance of levying expectation on partner that intercourse should not consistently be painful - Advised pelvic rest and abstinence while being assessed for multiple complaints  3. Hematuria, unspecified type - Negative for TTP, dysuria, flank pain, CVAT, fever - Will send urine culture due to report of intermittent hematuria - Urine Culture  4. Hx of chlamydia infection - Reviewed highly contagious nature of STDS and prevalence of Chlamydia in [place] - S/p treatment for Chlamydia, unsure about partner treatment and unsure of treatment of other sexual partners. Entirely possible patient is experiencing reinfection - Advised presumptive treatment ASAP of boyfriend as well as any other partners he may have, abstinence for all partners for minimum of 10 days after treatment, condom use to limit chances of reinfection  5. Hx: UTI (urinary tract infection) - Based on virtual visit 06/30 for acute cystitis, rx Macrobid and  completed course  6. Nexplanon in place   Will follow up results of vaginal swab and urine culture and manage accordingly. Routine preventative health maintenance measures emphasized. Please refer to After Visit Summary for other counseling recommendations.      Clayton Bibles, MSA, MSN, CNM Certified Nurse Midwife, Biochemist, clinical for [employer], [facility]

## 2022-03-21 ENCOUNTER — Encounter: Payer: Self-pay | Admitting: Advanced Practice Midwife

## 2022-03-24 ENCOUNTER — Other Ambulatory Visit: Payer: Self-pay | Admitting: *Deleted

## 2022-03-24 MED ORDER — METRONIDAZOLE 500 MG PO TABS: 500.0000 mg | ORAL_TABLET | Freq: Two times a day (BID) | ORAL | 0 refills | Status: AC

## 2022-04-07 DIAGNOSIS — Z419 Encounter for procedure for purposes other than remedying health state, unspecified: Secondary | ICD-10-CM | POA: Diagnosis not present

## 2022-04-15 ENCOUNTER — Other Ambulatory Visit: Payer: Self-pay

## 2022-04-15 ENCOUNTER — Telehealth: Payer: Self-pay | Admitting: Family Medicine

## 2022-04-15 ENCOUNTER — Ambulatory Visit
Admission: RE | Admit: 2022-04-15 | Discharge: 2022-04-15 | Disposition: A | Discharge status: Home / Self Care | Payer: Medicaid Other | Source: Ambulatory Visit | Attending: Family Medicine | Admitting: Family Medicine

## 2022-04-15 VITALS — BP 120/80 | HR 108 | Temp 98.7°F | Resp 16

## 2022-04-15 DIAGNOSIS — Z202 Contact with and (suspected) exposure to infections with a predominantly sexual mode of transmission: Secondary | ICD-10-CM | POA: Diagnosis not present

## 2022-04-15 DIAGNOSIS — N760 Acute vaginitis: Secondary | ICD-10-CM | POA: Diagnosis not present

## 2022-04-15 DIAGNOSIS — L209 Atopic dermatitis, unspecified: Secondary | ICD-10-CM | POA: Insufficient documentation

## 2022-04-15 DIAGNOSIS — N3001 Acute cystitis with hematuria: Secondary | ICD-10-CM | POA: Diagnosis not present

## 2022-04-15 LAB — POCT URINALYSIS DIP (MANUAL ENTRY)
Bilirubin, UA: NEGATIVE
Glucose, UA: NEGATIVE mg/dL
Ketones, POC UA: NEGATIVE mg/dL
Nitrite, UA: NEGATIVE
Spec Grav, UA: >=1.03 — AB (ref 1.010–1.025)
Urobilinogen, UA: 1 E.U./dL
pH, UA: 6 (ref 5.0–8.0)

## 2022-04-15 LAB — POCT URINE PREGNANCY: Preg Test, Ur: NEGATIVE

## 2022-04-15 MED ORDER — NITROFURANTOIN MONOHYD MACRO 100 MG PO CAPS: 100.0000 mg | ORAL_CAPSULE | Freq: Two times a day (BID) | ORAL | 0 refills | Status: AC

## 2022-04-15 MED ORDER — TRIAMCINOLONE ACETONIDE 0.025 % EX CREA
1.0000 | TOPICAL_CREAM | Freq: Two times a day (BID) | CUTANEOUS | 0 refills | Status: DC | PRN
Start: 2022-04-15 — End: 2023-10-12

## 2022-04-15 MED ORDER — FLUCONAZOLE 150 MG PO TABS: 150.0000 mg | ORAL_TABLET | ORAL | 0 refills | Status: DC | PRN

## 2022-04-15 NOTE — ED Triage Notes (Signed)
Pt reports having unprotected sex and now has Vag discharge ,dysuria and rash.

## 2022-04-15 NOTE — Telephone Encounter (Signed)
Urine culture order placed from today's encounter.

## 2022-04-15 NOTE — ED Provider Notes (Signed)
[facility]    CSN: [id number] Arrival date & time: 04/15/22  1659      History   Chief Complaint Chief Complaint  Patient presents with   Exposure to STD    recently had unprotected sex with someone and now i'm having skin rashes, bumps all over my body, white discharge, burning feeling inside, and itching down there. not sure if it could be yeast or something else. - Entered by patient   Vaginal Discharge   Rash    HPI Sarah Spears is a 21 y.o. female.   HPI Patient here today for STD testing.  Patient is concerned that she may have contracted an STI due to recent unprotected sex.  She reports symptoms of vaginal itching, burning and abnormal white discharge.  She is also concerned as she has developed a skin rash. Reports a history of eczema however, symptoms have been well controlled. She noticed eruption of a fine rash approximately 4-5 days ago and which has been mildly itchy distributed to her back, lower torso bilateral hands, arms and abdomen.  She recently started taking a natural supplement over 2 weeks ago and initially thought this could be the source of the skin rash or recent change in body wash.  She denies any other changes in diet or medications. Past Medical History:  Diagnosis Date   ADHD (attention deficit hyperactivity disorder)    Anxiety    Chlamydia    COVID-19 2022   Depression    Headache    Seasonal allergies     Patient Active Problem List   Diagnosis Date Noted   Chlamydia contact, treated 03/20/2022   Dyspareunia in female 03/20/2022   Parent-child conflict 11/27/2019   Bipolar 2 disorder, major depressive episode (HCC) 10/30/2019   Overdose 11/29/2017   Severe recurrent major depression without psychotic features (HCC) 11/28/2017    Past Surgical History:  Procedure Laterality Date   REMOVAL OF IMPLANON ROD  07/08/2021    OB History     Gravida  0   Para  0   Term  0   Preterm  0   AB  0   Living  0      SAB   0   IAB  0   Ectopic  0   Multiple  0   Live Births  0            Home Medications    Prior to Admission medications   Medication Sig Start Date End Date Taking? Authorizing Provider  fluconazole (DIFLUCAN) 150 MG tablet Take 1 tablet (150 mg total) by mouth every three (3) days as needed. 04/15/22  Yes Bing Neighbors, FNP  nitrofurantoin, macrocrystal-monohydrate, (MACROBID) 100 MG capsule Take 1 capsule (100 mg total) by mouth 2 (two) times daily for 3 days. 04/15/22 04/18/22 Yes Bing Neighbors, FNP  triamcinolone (KENALOG) 0.025 % cream Apply 1 Application topically 2 (two) times daily as needed. 04/15/22  Yes Bing Neighbors, FNP  escitalopram (LEXAPRO) 10 MG tablet Take 1 tablet (10 mg total) by mouth daily. Patient not taking: Reported on 03/19/2022 11/21/20   Jesse Sans, MD  etonogestrel (NEXPLANON) 68 MG IMPL implant 1 each by Subdermal route once. 08/18/21   [provider]  hydrOXYzine (ATARAX/VISTARIL) 25 MG tablet Take 1 tablet (25 mg total) by mouth 3 (three) times daily as needed for anxiety. Patient not taking: Reported on 03/19/2022 11/20/20   Jesse Sans, MD  metroNIDAZOLE (FLAGYL) 500  MG tablet Take 1 tablet (500 mg total) by mouth 2 (two) times daily. Patient not taking: Reported on 03/19/2022 01/12/22   Merrilee Jansky, MD  traZODone (DESYREL) 50 MG tablet Take 0.5 tablets (25 mg total) by mouth at bedtime as needed for sleep. Patient not taking: Reported on 03/19/2022 11/20/20   Jesse Sans, MD    Family History Family History  Problem Relation Age of Onset   Bipolar disorder Mother    Anxiety disorder Mother    Drug abuse Mother    Depression Mother    ADD / ADHD Mother    Seizures Sister    Bipolar disorder Maternal Aunt    Anxiety disorder Maternal Aunt    Depression Maternal Aunt    Alcohol abuse Maternal Aunt    Drug abuse Maternal Aunt    Post-traumatic stress disorder Maternal Aunt    Bipolar disorder Maternal  Grandfather    Anxiety disorder Maternal Grandfather    Depression Maternal Grandfather    Post-traumatic stress disorder Maternal Grandmother    Depression Maternal Grandmother    Bipolar disorder Maternal Grandmother    Anxiety disorder Maternal Grandmother     Social History Social History   Tobacco Use   Smoking status: Never    Passive exposure: Past   Smokeless tobacco: Never  Vaping Use   Vaping Use: Never used  Substance Use Topics   Alcohol use: No   Drug use: No     Allergies   Lactose intolerance (gi)   Review of Systems Review of Systems Pertinent negatives listed in HPI   Physical Exam Triage Vital Signs ED Triage Vitals [04/15/22 1743]  Enc Vitals Group     BP 120/80     Pulse Rate (!) 108     Resp 16     Temp 98.7 F (37.1 C)     Temp Source Oral     SpO2 98 %     Weight      Height      Head Circumference      Peak Flow      Pain Score      Pain Loc      Pain Edu?      Excl. in GC?    No data found.  Updated Vital Signs BP 120/80 (BP Location: Left Arm)   Pulse (!) 108   Temp 98.7 F (37.1 C) (Oral)   Resp 16   SpO2 98%   Visual Acuity Right Eye Distance:   Left Eye Distance:   Bilateral Distance:    Right Eye Near:   Left Eye Near:    Bilateral Near:     Physical Exam General appearance: Alert, well developed, well nourished, cooperative and in no distress Head: Normocephalic, without obvious abnormality, atraumatic Heart: Rate and rhythm normal.   Respiratory: Respirations even and unlabored, normal respiratory rate CVA:  no flank pain Extremities: No gross deformities Skin: Skin color, texture, turgor normal. Maculopapular rash generalized upper body and BUE Psych: Appropriate mood and affect.   UC Treatments / Results  Labs (all labs ordered are listed, but only abnormal results are displayed) Labs Reviewed  POCT URINALYSIS DIP (MANUAL ENTRY) - Abnormal; Notable for the following components:      Result Value    Clarity, UA cloudy (*)    Spec Grav, UA >=1.030 (*)    Blood, UA trace-intact (*)    Protein Ur, POC trace (*)    Leukocytes, UA Large (3+) (*)  All other components within normal limits  POCT URINE PREGNANCY  CERVICOVAGINAL ANCILLARY ONLY    EKG   Radiology No results found.  Procedures Procedures (including critical care time)  Medications Ordered in UC Medications - No data to display  Initial Impression / Assessment and Plan / UC Course  I have reviewed the triage vital signs and the nursing notes.  Pertinent labs & imaging results that were available during my care of the patient were reviewed by me and considered in my medical decision making (see chart for details).    Vaginal cytology and urine culture pending. Treating for presumptive UTI as patient had a large amount of leuks in her urine and is having some dysuria.  Diflucan prescribed for yeast prophylaxis as patient was prescribed antibiotic therapy for UTI. Advise given the unknown status of STI to avoid having sexual contact.  Treating rashes atopic dermatitis as exam findings is consistent with exacerbation of eczema.  Patient verbalized understanding and agreement with plan.  Return precautions given. Final Clinical Impressions(s) / UC Diagnoses   Final diagnoses:  Exposure to STD  Acute cystitis with hematuria  Vaginitis and vulvovaginitis  Atopic dermatitis, unspecified type     Discharge Instructions      Your lab results will update directly to MyChart. Expect your results within 3 days.  Our office will only call if your results are abnormal and require additional treatment.  I am treating you presumptively for a UTI while awaiting urinary culture.  Take medication as prescribed.  Hydrate well with fluids.  Refrain from having sexual intercourse until results are known.     ED Prescriptions     Medication Sig Dispense Auth. Provider   nitrofurantoin, macrocrystal-monohydrate, (MACROBID) 100  MG capsule Take 1 capsule (100 mg total) by mouth 2 (two) times daily for 3 days. 6 capsule Bing Neighbors, FNP   fluconazole (DIFLUCAN) 150 MG tablet Take 1 tablet (150 mg total) by mouth every three (3) days as needed. 2 tablet Bing Neighbors, FNP   triamcinolone (KENALOG) 0.025 % cream Apply 1 Application topically 2 (two) times daily as needed. 454 g Bing Neighbors, FNP      PDMP not reviewed this encounter.   Bing Neighbors, FNP 04/15/22 [phone]

## 2022-04-15 NOTE — Discharge Instructions (Addendum)
Your lab results will update directly to MyChart. Expect your results within 3 days.  Our office will only call if your results are abnormal and require additional treatment.  I am treating you presumptively for a UTI while awaiting urinary culture.  Take medication as prescribed.  Hydrate well with fluids.  Refrain from having sexual intercourse until results are known.

## 2022-04-16 LAB — CERVICOVAGINAL ANCILLARY ONLY
Bacterial Vaginitis (gardnerella): POSITIVE — AB
Candida Glabrata: NEGATIVE
Candida Vaginitis: POSITIVE — AB
Chlamydia: NEGATIVE
Comment: NEGATIVE
Comment: NEGATIVE
Comment: NEGATIVE
Comment: NEGATIVE
Comment: NEGATIVE
Comment: NORMAL
Neisseria Gonorrhea: NEGATIVE
Trichomonas: NEGATIVE

## 2022-04-17 ENCOUNTER — Telehealth (HOSPITAL_COMMUNITY): Payer: Self-pay | Admitting: Emergency Medicine

## 2022-04-17 LAB — URINE CULTURE: Culture: NO GROWTH

## 2022-04-17 MED ORDER — METRONIDAZOLE 500 MG PO TABS: 500.0000 mg | ORAL_TABLET | Freq: Two times a day (BID) | ORAL | 0 refills | Status: DC

## 2022-04-20 ENCOUNTER — Other Ambulatory Visit (INDEPENDENT_AMBULATORY_CARE_PROVIDER_SITE_OTHER): Payer: Self-pay | Admitting: Family Medicine

## 2022-05-08 DIAGNOSIS — Z419 Encounter for procedure for purposes other than remedying health state, unspecified: Secondary | ICD-10-CM | POA: Diagnosis not present

## 2022-06-07 DIAGNOSIS — Z419 Encounter for procedure for purposes other than remedying health state, unspecified: Secondary | ICD-10-CM | POA: Diagnosis not present

## 2022-06-17 ENCOUNTER — Other Ambulatory Visit (HOSPITAL_COMMUNITY)
Admission: RE | Admit: 2022-06-17 | Discharge: 2022-06-17 | Disposition: A | Discharge status: Home / Self Care | Payer: Medicaid Other | Source: Ambulatory Visit | Attending: Family Medicine | Admitting: Family Medicine

## 2022-06-17 ENCOUNTER — Ambulatory Visit (INDEPENDENT_AMBULATORY_CARE_PROVIDER_SITE_OTHER): Payer: Medicaid Other | Admitting: Family Medicine

## 2022-06-17 ENCOUNTER — Encounter: Payer: Self-pay | Admitting: Family Medicine

## 2022-06-17 VITALS — BP 113/73 | HR 81 | Wt 142.0 lb

## 2022-06-17 DIAGNOSIS — N898 Other specified noninflammatory disorders of vagina: Secondary | ICD-10-CM | POA: Insufficient documentation

## 2022-06-17 DIAGNOSIS — N939 Abnormal uterine and vaginal bleeding, unspecified: Secondary | ICD-10-CM | POA: Insufficient documentation

## 2022-06-17 MED ORDER — DOXYCYCLINE HYCLATE 100 MG PO CAPS: 100.0000 mg | ORAL_CAPSULE | Freq: Two times a day (BID) | ORAL | 0 refills | Status: DC

## 2022-06-17 NOTE — Progress Notes (Signed)
RGYN pt here with complaints of having irregular bleeding x 1 month none today.  Contraception : Nexplanon 08/18/21.  Pt wants STD Screening.  Pt would like to screen for BV and yeast.

## 2022-06-17 NOTE — Progress Notes (Signed)
   Subjective:    Patient ID: Sarah Spears is a 21 y.o. female presenting with No chief complaint on file.  on 06/17/2022  HPI: Usual cycle is 4-5 days. Same since insertion of Nexplanon. Last month had more bleeding, but this has now stopped. Has h/o chlamydia. Has vaginal discharge. No fever, no chills. Has some abdominal pain.  Review of Systems  Constitutional:  Negative for chills and fever.  Respiratory:  Negative for shortness of breath.   Cardiovascular:  Negative for chest pain.  Gastrointestinal:  Positive for abdominal pain. Negative for nausea and vomiting.  Genitourinary:  Negative for dysuria.  Skin:  Negative for rash.      Objective:    BP 113/73   Pulse 81   Wt 142 lb (64.4 kg)   BMI 26.83 kg/m  Physical Exam Exam conducted with a chaperone present.  Constitutional:      General: She is not in acute distress.    Appearance: She is well-developed.  HENT:     Head: Normocephalic and atraumatic.  Eyes:     General: No scleral icterus. Cardiovascular:     Rate and Rhythm: Normal rate.  Pulmonary:     Effort: Pulmonary effort is normal.  Abdominal:     Palpations: Abdomen is soft.     Tenderness: There is abdominal tenderness. There is rebound.  Musculoskeletal:     Cervical back: Neck supple.  Skin:    General: Skin is warm and dry.  Neurological:     Mental Status: She is alert and oriented to person, place, and time.         Assessment & Plan:   Problem List Items Addressed This Visit       Unprioritized   Abnormal uterine bleeding - Primary    ? Endometritis vs. PID, given h/o STI and exam. Will treat presumptively. If not improving, consider further w/u.      Other Visit Diagnoses     Vaginal discharge       Check self swab and treat accordingly   Relevant Medications   doxycycline (VIBRAMYCIN) 100 MG capsule   Other Relevant Orders   Cervicovaginal ancillary only( )        Return in about 3 months (around  09/17/2022) for a follow-up.  Donnamae Jude, MD 06/17/2022 8:15 AM

## 2022-06-17 NOTE — Assessment & Plan Note (Addendum)
?   Endometritis vs. PID, given h/o STI and exam. Will treat presumptively. If not improving, consider further w/u.

## 2022-06-18 ENCOUNTER — Telehealth: Payer: Self-pay

## 2022-06-18 LAB — CERVICOVAGINAL ANCILLARY ONLY
Bacterial Vaginitis (gardnerella): POSITIVE — AB
Candida Glabrata: NEGATIVE
Candida Vaginitis: POSITIVE — AB
Chlamydia: POSITIVE — AB
Comment: NEGATIVE
Comment: NEGATIVE
Comment: NEGATIVE
Comment: NEGATIVE
Comment: NEGATIVE
Comment: NORMAL
Neisseria Gonorrhea: NEGATIVE
Trichomonas: NEGATIVE

## 2022-06-18 MED ORDER — METRONIDAZOLE 500 MG PO TABS: 500.0000 mg | ORAL_TABLET | Freq: Two times a day (BID) | ORAL | 0 refills | Status: AC

## 2022-06-18 MED ORDER — FLUCONAZOLE 150 MG PO TABS: 150.0000 mg | ORAL_TABLET | Freq: Once | ORAL | 2 refills | Status: AC

## 2022-06-18 NOTE — Telephone Encounter (Signed)
LVM for pt to return call to office,

## 2022-06-18 NOTE — Telephone Encounter (Signed)
-----   Message from Donnamae Jude, MD sent at 06/18/2022  2:25 PM EDT ----- Please report Positive chlamydia

## 2022-06-18 NOTE — Addendum Note (Signed)
Addended by: Donnamae Jude on: 06/18/2022 02:25 PM   Modules accepted: Orders

## 2022-07-02 ENCOUNTER — Ambulatory Visit: Payer: Medicaid Other | Admitting: Obstetrics and Gynecology

## 2022-07-17 ENCOUNTER — Ambulatory Visit: Payer: Medicaid Other

## 2022-07-21 ENCOUNTER — Other Ambulatory Visit (HOSPITAL_COMMUNITY)
Admission: RE | Admit: 2022-07-21 | Discharge: 2022-07-21 | Disposition: A | Discharge status: Home / Self Care | Payer: Medicaid Other | Source: Ambulatory Visit | Attending: Obstetrics & Gynecology | Admitting: Obstetrics & Gynecology

## 2022-07-21 ENCOUNTER — Ambulatory Visit (INDEPENDENT_AMBULATORY_CARE_PROVIDER_SITE_OTHER): Payer: Self-pay | Admitting: *Deleted

## 2022-07-21 DIAGNOSIS — R300 Dysuria: Secondary | ICD-10-CM

## 2022-07-21 DIAGNOSIS — Z8619 Personal history of other infectious and parasitic diseases: Secondary | ICD-10-CM | POA: Insufficient documentation

## 2022-07-21 DIAGNOSIS — B3731 Acute candidiasis of vulva and vagina: Secondary | ICD-10-CM

## 2022-07-21 DIAGNOSIS — B9689 Other specified bacterial agents as the cause of diseases classified elsewhere: Secondary | ICD-10-CM

## 2022-07-21 LAB — POCT URINALYSIS DIPSTICK
Blood, UA: NEGATIVE
Leukocytes, UA: NEGATIVE

## 2022-07-21 NOTE — Progress Notes (Signed)
SUBJECTIVE:  21 y.o. female here for TOC for chlamydia  and urinary frequency and burning.  Denies abnormal vaginal bleeding or significant pelvic pain or fever.Denies history of known exposure to STD.  No LMP recorded.  OBJECTIVE:  She appears alert, well appearing, in no apparent distress Urine dipstick: negative for all components.  ASSESSMENT:  History of chlamydia infection Dysuria    PLAN:  GC, chlamydia, trichomonas, BVAG, CVAG probe.  Treatment: To be determined once lab results are received ROV prn if symptoms persist or worsen.   Scheryl Marten, RN   Patient was assessed and managed by nursing staff during this encounter. I have reviewed the chart and agree with the documentation and plan. I have also made any necessary editorial changes.  Jaynie Collins, MD 07/22/2022 2:59 PM

## 2022-07-23 LAB — CERVICOVAGINAL ANCILLARY ONLY
Bacterial Vaginitis (gardnerella): POSITIVE — AB
Candida Glabrata: NEGATIVE
Candida Vaginitis: POSITIVE — AB
Chlamydia: NEGATIVE
Comment: NEGATIVE
Comment: NEGATIVE
Comment: NEGATIVE
Comment: NEGATIVE
Comment: NEGATIVE
Comment: NORMAL
Neisseria Gonorrhea: NEGATIVE
Trichomonas: NEGATIVE

## 2022-07-25 MED ORDER — FLUCONAZOLE 150 MG PO TABS: 150.0000 mg | ORAL_TABLET | Freq: Once | ORAL | 3 refills | Status: AC

## 2022-07-25 MED ORDER — METRONIDAZOLE 500 MG PO TABS: 500.0000 mg | ORAL_TABLET | Freq: Two times a day (BID) | ORAL | 0 refills | Status: AC

## 2022-07-25 NOTE — Addendum Note (Signed)
Addended by: Jaynie Collins A on: 07/25/2022 11:22 AM   Modules accepted: Orders

## 2022-08-16 ENCOUNTER — Encounter: Payer: Self-pay | Admitting: Advanced Practice Midwife

## 2022-08-17 ENCOUNTER — Encounter: Payer: Self-pay | Admitting: Obstetrics and Gynecology

## 2022-08-21 ENCOUNTER — Ambulatory Visit
Admission: RE | Admit: 2022-08-21 | Discharge: 2022-08-21 | Disposition: A | Discharge status: Home / Self Care | Payer: Medicaid Other | Source: Ambulatory Visit

## 2022-08-25 ENCOUNTER — Ambulatory Visit (INDEPENDENT_AMBULATORY_CARE_PROVIDER_SITE_OTHER): Payer: Self-pay

## 2022-08-25 ENCOUNTER — Other Ambulatory Visit (HOSPITAL_COMMUNITY)
Admission: RE | Admit: 2022-08-25 | Discharge: 2022-08-25 | Disposition: A | Discharge status: Home / Self Care | Payer: Medicaid Other | Source: Ambulatory Visit | Attending: Obstetrics & Gynecology | Admitting: Obstetrics & Gynecology

## 2022-08-25 VITALS — BP 121/77 | HR 91

## 2022-08-25 DIAGNOSIS — Z113 Encounter for screening for infections with a predominantly sexual mode of transmission: Secondary | ICD-10-CM

## 2022-08-25 NOTE — Addendum Note (Signed)
Addended by: Scheryl Marten on: 08/25/2022 05:04 PM   Modules accepted: Orders

## 2022-08-25 NOTE — Progress Notes (Signed)
SUBJECTIVE:  21 y.o. female who desires a STI screen. Denies abnormal vaginal discharge, bleeding or significant pelvic pain. No UTI symptoms. Denies history of known exposure to STD.  No LMP recorded.  OBJECTIVE:  She appears well.   ASSESSMENT:  STI Screen   PLAN:  Pt offered STI blood screening-not indicated GC, chlamydia, and trichomonas,BV and yeast  probe sent to lab.  Treatment: To be determined once lab results are received.  Pt follow up as needed.

## 2022-08-27 LAB — CERVICOVAGINAL ANCILLARY ONLY
Bacterial Vaginitis (gardnerella): POSITIVE — AB
Candida Glabrata: NEGATIVE
Candida Vaginitis: NEGATIVE
Chlamydia: NEGATIVE
Comment: NEGATIVE
Comment: NEGATIVE
Comment: NEGATIVE
Comment: NEGATIVE
Comment: NEGATIVE
Comment: NORMAL
Neisseria Gonorrhea: NEGATIVE
Trichomonas: NEGATIVE

## 2022-08-28 ENCOUNTER — Other Ambulatory Visit: Payer: Self-pay | Admitting: Obstetrics and Gynecology

## 2022-08-28 DIAGNOSIS — B9689 Other specified bacterial agents as the cause of diseases classified elsewhere: Secondary | ICD-10-CM

## 2022-08-28 MED ORDER — METRONIDAZOLE 500 MG PO TABS: 500.0000 mg | ORAL_TABLET | Freq: Two times a day (BID) | ORAL | 0 refills | Status: DC

## 2022-10-22 ENCOUNTER — Encounter: Payer: Self-pay | Admitting: Obstetrics and Gynecology

## 2022-10-22 ENCOUNTER — Ambulatory Visit (INDEPENDENT_AMBULATORY_CARE_PROVIDER_SITE_OTHER): Payer: Self-pay | Admitting: Obstetrics and Gynecology

## 2022-10-22 VITALS — BP 109/71 | HR 87 | Wt 141.0 lb

## 2022-10-22 DIAGNOSIS — N760 Acute vaginitis: Secondary | ICD-10-CM

## 2022-10-22 DIAGNOSIS — N898 Other specified noninflammatory disorders of vagina: Secondary | ICD-10-CM

## 2022-10-22 DIAGNOSIS — B9689 Other specified bacterial agents as the cause of diseases classified elsewhere: Secondary | ICD-10-CM

## 2022-10-22 DIAGNOSIS — N761 Subacute and chronic vaginitis: Secondary | ICD-10-CM

## 2022-10-22 NOTE — Progress Notes (Signed)
RGYN here for problem visit today.  Last Seen 08/25/22 for nurse visit. Pt was not able to get Rx due to ins and cost   CC: vaginal odor and discharge. Wants STD screening has Family [employer] ins.   Contraception: Nexplanon  LMP:10/05/22 -10/13/22

## 2022-10-22 NOTE — Progress Notes (Signed)
Obstetrics and Gynecology Visit Return Patient Evaluation  Appointment Date: 10/22/2022   OBGYN Clinic: [facility]   Chief Complaint: vaginal discharge and itching.   History of Present Illness:  Sarah Spears is a 22 y.o. with above CC. Patient has periods with the nexplanon and LMP ended about two weeks ago. Pt not sexually active and doesn't notice a concordance with her periods. Patient diagnosed with BV in late December but unable to pick up Rx. She does not tub bathe or use swimming pools. She does use tampons   Patient states s/s started when she was diagnosed with chlamydia in May 2023.   Review of Systems:  as noted in the History of Present Illness.  Patient Active Problem List   Diagnosis Date Noted   Abnormal uterine bleeding 06/17/2022   Dyspareunia in female 03/20/2022   Parent-child conflict [id number]   Bipolar 2 disorder, major depressive episode ([place]) 10/30/2019   Overdose 11/29/2017   Severe recurrent major depression without psychotic features ([place]) 11/28/2017   Medications:  Sarah Spears had no medications administered during this visit. Current Outpatient Medications  Medication Sig Dispense Refill   etonogestrel (NEXPLANON) 68 MG IMPL implant 1 each by Subdermal route once.     No current facility-administered medications for this visit.    Allergies: is allergic to lactose intolerance (gi).  Physical Exam:  BP 109/71   Pulse 87   Wt 141 lb (64 kg)   BMI 26.64 kg/m  Body mass index is 26.64 kg/m. General appearance: Well nourished, well developed female in no acute distress.  Abdomen: diffusely non tender to palpation, non distended, and no masses, hernias Neuro/Psych:  Normal mood and affect.    Pelvic exam:  EGBUS: mild erythema on bilateral labia and some white, clumpy discharge   Assessment: patient stable  Plan:  1. Chronic vaginitis F/u swab results for treatment. Pt also told that switching  away from tampons may help and avoiding prolonged wearing of tight workup bottoms. Also probiotics recommended like sauerkraut, kimchee and/or coconut kefir.  - NuSwab Vaginitis Plus (VG+)  2. Vaginal discharge   RTC: PRN  Sarah Romans MD Attending Center for [facility])

## 2022-10-25 LAB — NUSWAB VAGINITIS PLUS (VG+)
Atopobium vaginae: HIGH Score — AB
Candida albicans, NAA: NEGATIVE
Candida glabrata, NAA: NEGATIVE
Chlamydia trachomatis, NAA: NEGATIVE
Neisseria gonorrhoeae, NAA: NEGATIVE
Trich vag by NAA: NEGATIVE

## 2022-10-28 MED ORDER — METRONIDAZOLE 500 MG PO TABS: 500.0000 mg | ORAL_TABLET | Freq: Two times a day (BID) | ORAL | 0 refills | Status: DC

## 2022-10-28 NOTE — Addendum Note (Signed)
Addended by: Aletha Halim on: 10/28/2022 01:19 PM   Modules accepted: Orders

## 2022-11-01 ENCOUNTER — Telehealth: Payer: Medicaid Other | Admitting: Physician Assistant

## 2022-11-01 DIAGNOSIS — N760 Acute vaginitis: Secondary | ICD-10-CM

## 2022-11-01 NOTE — Progress Notes (Signed)
Because you are currently on an antibiotic, I feel your condition warrants further evaluation including a urinalysis and vaginal swab for vaginal yeast/bacterial infection and to rule out STDs and I recommend that you be seen in a face to face visit.   NOTE: There will be NO CHARGE for this eVisit   If you are having a true medical emergency please call 911.      For an urgent face to face visit, [place] has eight urgent care centers for your convenience:   NEW!! [place] [facility] [place] Get Driving Directions [id number] [address] [place], [place] [facility] [place] Get Driving Directions [id number] [place], [place] [address]   [place] [facility]) Get Driving Directions [id number] [address] [place], [place] [address]  [place] [facility] [place] ([place]) Get Driving Directions [id number] [address] [place],  [place]  [address]  [place] [facility] - at [place] Get Driving Directions  [phone] [address] [place],  [place] [address]   [place] [facility] Get Driving Directions [id number] [place], [place], [place] [address]   [place] [facility] [place] Get Driving Directions  [id number] [address] [place], [place] [address]   [place] [facility] [place] Get Driving Directions [id number] [address] [place], [place] [address]  Your MyChart E-visit questionnaire answers were reviewed by a board certified advanced clinical practitioner to complete your personal care plan based on your specific symptoms.  Thank you for using e-Visits.

## 2022-11-19 ENCOUNTER — Telehealth: Payer: Self-pay | Admitting: Nurse Practitioner

## 2022-11-19 DIAGNOSIS — N3001 Acute cystitis with hematuria: Secondary | ICD-10-CM

## 2022-11-19 MED ORDER — CEPHALEXIN 500 MG PO CAPS: 500.0000 mg | ORAL_CAPSULE | Freq: Two times a day (BID) | ORAL | 0 refills | Status: AC

## 2022-11-19 NOTE — Progress Notes (Signed)

## 2023-01-07 IMAGING — CR DG CHEST 2V
1 series · 2 of 2 positions shown · non-contrast
Comparison: None

CLINICAL DATA: Shortness of breath and chest pain

EXAM:
CHEST - 2 VIEW

[Series 1: w chest pa · 0.14mm/px · 2 of 2 slices shown]
[im 1/2]
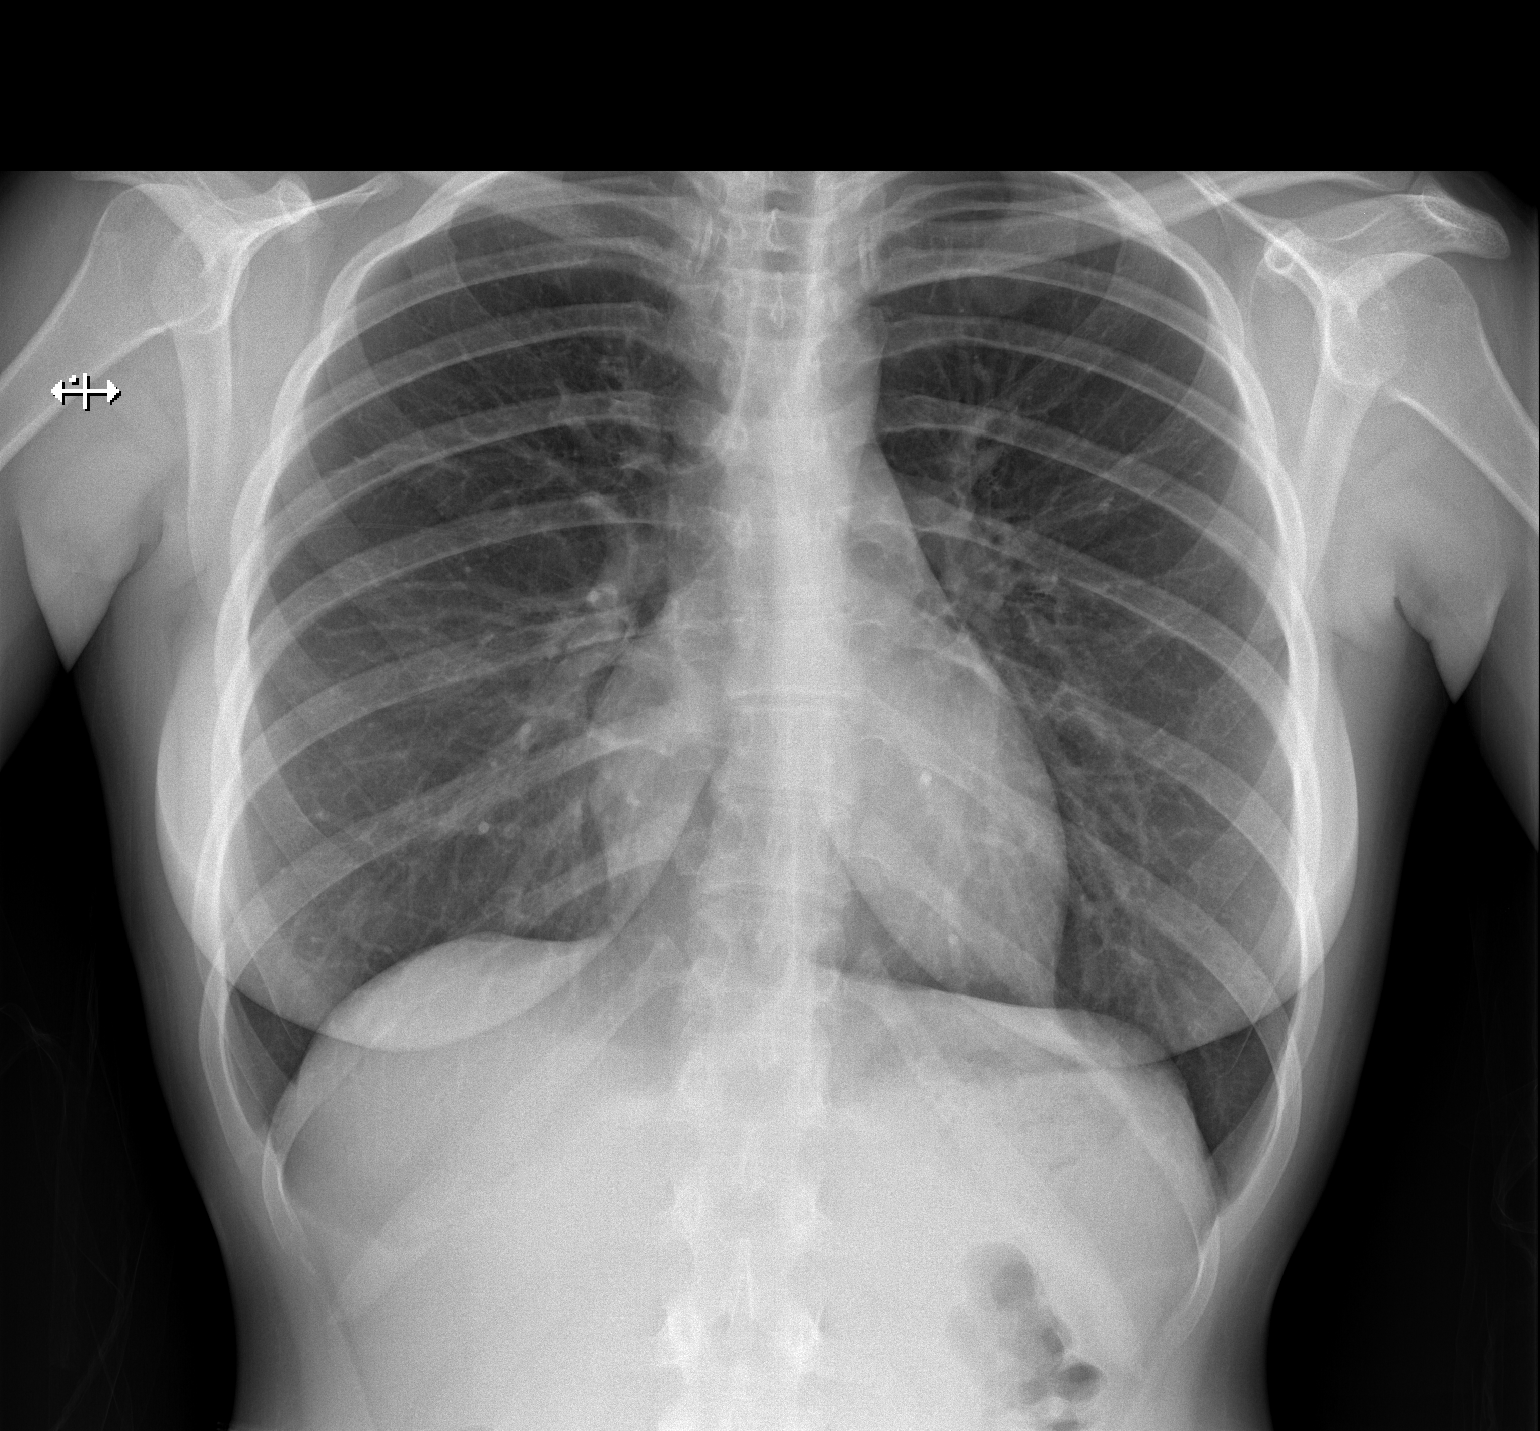
[im 2/2]
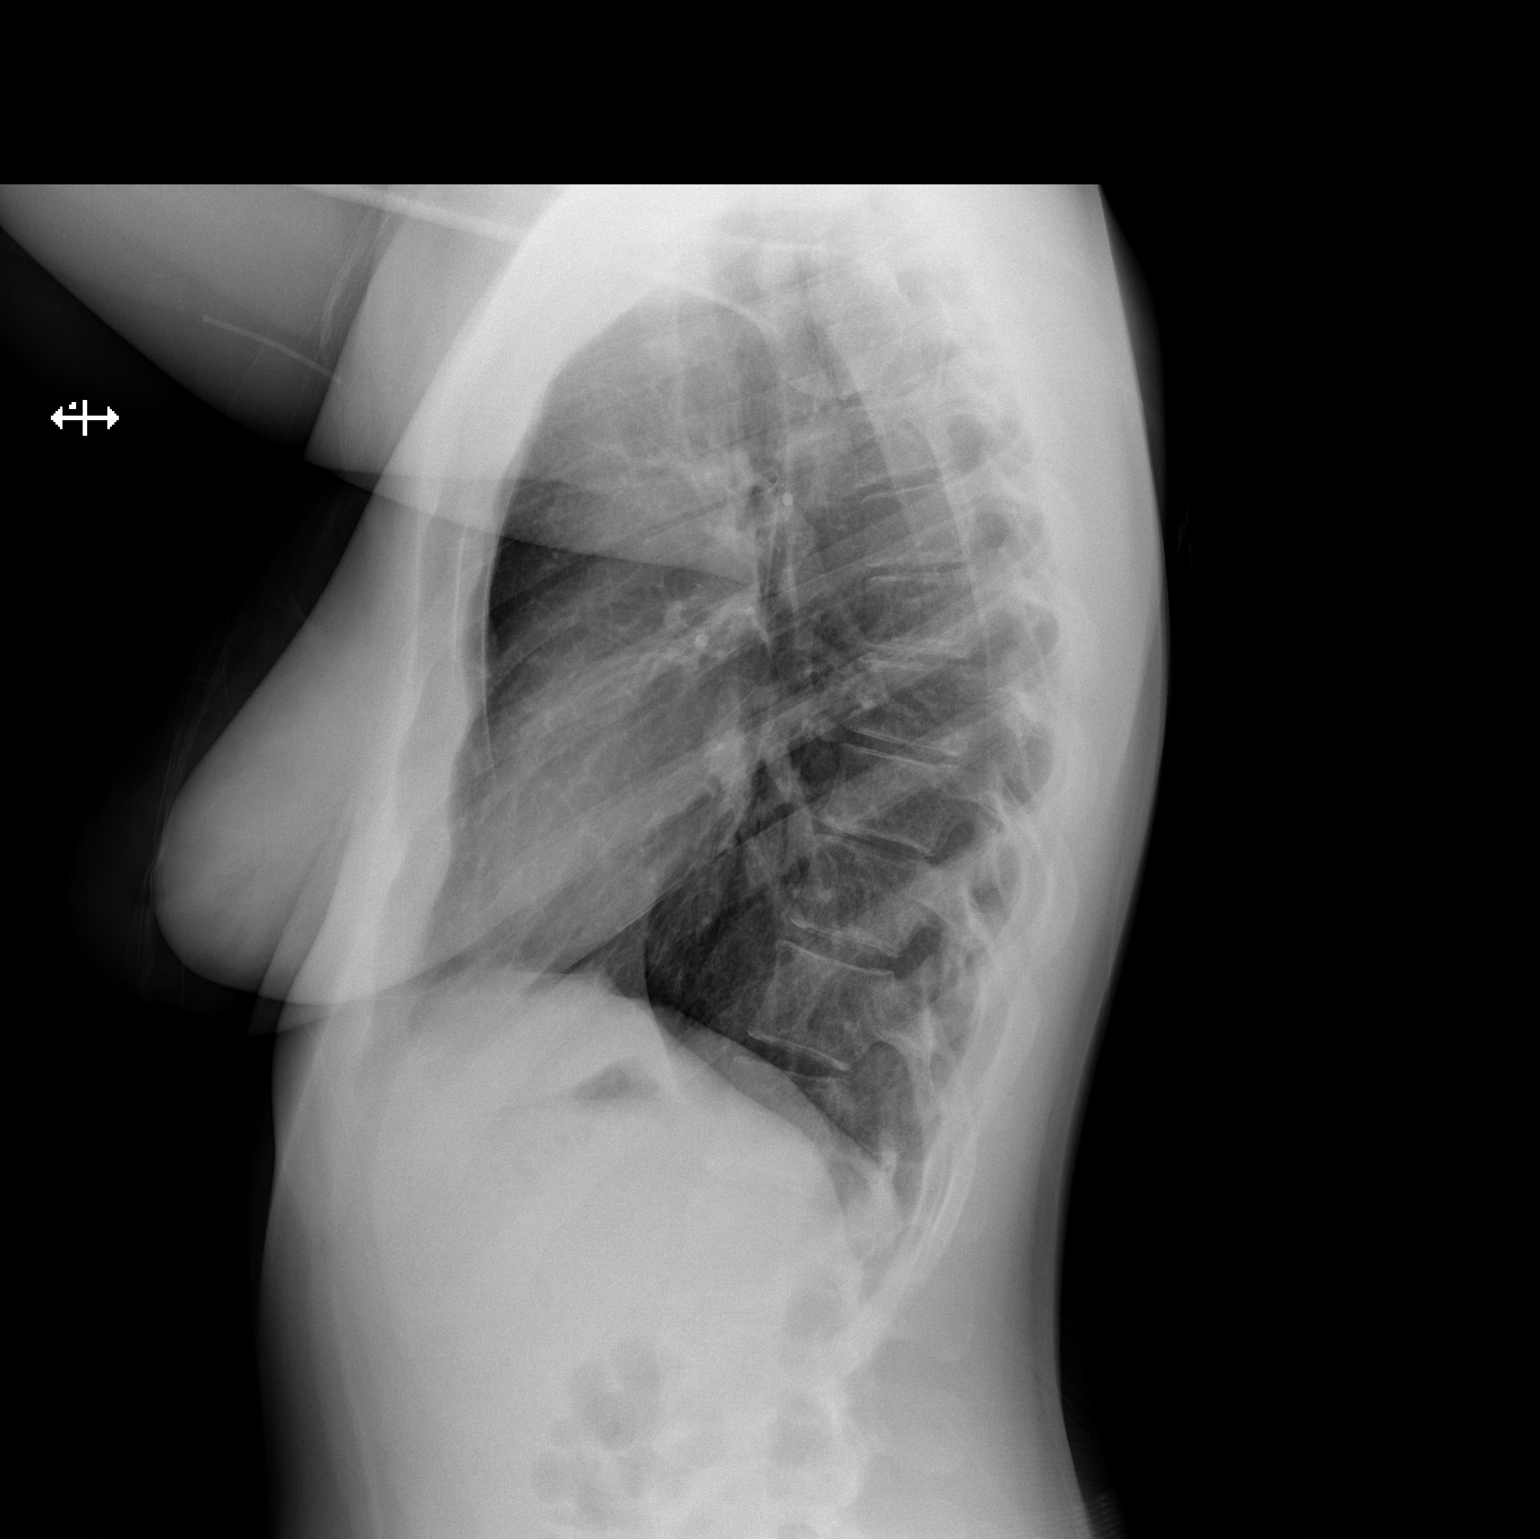

[2 of 2 positions shown; findings below may reference images not displayed]

FINDINGS: Lungs are clear. Heart size and pulmonary vascularity are normal. No
adenopathy. No pneumothorax. No bone lesions.
IMPRESSION: Lungs clear.  Cardiac silhouette normal.

## 2023-01-27 ENCOUNTER — Telehealth: Payer: Self-pay | Admitting: Physician Assistant

## 2023-01-27 DIAGNOSIS — B3731 Acute candidiasis of vulva and vagina: Secondary | ICD-10-CM

## 2023-01-27 MED ORDER — FLUCONAZOLE 150 MG PO TABS: 150.0000 mg | ORAL_TABLET | ORAL | 0 refills | Status: DC | PRN

## 2023-01-27 NOTE — Progress Notes (Signed)
E-Visit for Vaginal Symptoms  We are sorry that you are not feeling well. Here is how we plan to help! Based on what you shared with me it looks like you: May have a yeast vaginosis  Vaginosis is an inflammation of the vagina that can result in discharge, itching and pain. The cause is usually a change in the normal balance of vaginal bacteria or an infection. Vaginosis can also result from reduced estrogen levels after menopause.  The most common causes of vaginosis are:   Bacterial vaginosis which results from an overgrowth of one on several organisms that are normally present in your vagina.   Yeast infections which are caused by a naturally occurring fungus called candida.   Vaginal atrophy (atrophic vaginosis) which results from the thinning of the vagina from reduced estrogen levels after menopause.   Trichomoniasis which is caused by a parasite and is commonly transmitted by sexual intercourse.  Factors that increase your risk of developing vaginosis include: Medications, such as antibiotics and steroids Uncontrolled diabetes Use of hygiene products such as bubble bath, vaginal spray or vaginal deodorant Douching Wearing damp or tight-fitting clothing Using an intrauterine device (IUD) for birth control Hormonal changes, such as those associated with pregnancy, birth control pills or menopause Sexual activity Having a sexually transmitted infection  Your treatment plan is Diflucan (fluconazole) 150mg tablet once, repeat in 3 days if needed.  I have electronically sent this prescription into the pharmacy that you have chosen.  Be sure to take all of the medication as directed. Stop taking any medication if you develop a rash, tongue swelling or shortness of breath. Mothers who are breast feeding should consider pumping and discarding their breast milk while on these antibiotics. However, there is no consensus that infant exposure at these doses would be harmful.  Remember that  medication creams can weaken latex condoms. .   HOME CARE:  Good hygiene may prevent some types of vaginosis from recurring and may relieve some symptoms:  Avoid baths, hot tubs and whirlpool spas. Rinse soap from your outer genital area after a shower, and dry the area well to prevent irritation. Don't use scented or harsh soaps, such as those with deodorant or antibacterial action. Avoid irritants. These include scented tampons and pads. Wipe from front to back after using the toilet. Doing so avoids spreading fecal bacteria to your vagina.  Other things that may help prevent vaginosis include:  Don't douche. Your vagina doesn't require cleansing other than normal bathing. Repetitive douching disrupts the normal organisms that reside in the vagina and can actually increase your risk of vaginal infection. Douching won't clear up a vaginal infection. Use a latex condom. Both female and female latex condoms may help you avoid infections spread by sexual contact. Wear cotton underwear. Also wear pantyhose with a cotton crotch. If you feel comfortable without it, skip wearing underwear to bed. Yeast thrives in moist environments Your symptoms should improve in the next day or two.  GET HELP RIGHT AWAY IF:  You have pain in your lower abdomen ( pelvic area or over your ovaries) You develop nausea or vomiting You develop a fever Your discharge changes or worsens You have persistent pain with intercourse You develop shortness of breath, a rapid pulse, or you faint.  These symptoms could be signs of problems or infections that need to be evaluated by a medical provider now.  MAKE SURE YOU   Understand these instructions. Will watch your condition. Will get help right away   if you are not doing well or get worse.  Thank you for choosing an e-visit.  Your e-visit answers were reviewed by a board certified advanced clinical practitioner to complete your personal care plan. Depending upon the  condition, your plan could have included both over the counter or prescription medications.  Please review your pharmacy choice. Make sure the pharmacy is open so you can pick up prescription now. If there is a problem, you may contact your provider through MyChart messaging and have the prescription routed to another pharmacy.  Your safety is important to us. If you have drug allergies check your prescription carefully.   For the next 24 hours you can use MyChart to ask questions about today's visit, request a non-urgent call back, or ask for a work or school excuse. You will get an email in the next two days asking about your experience. I hope that your e-visit has been valuable and will speed your recovery.  I have spent 5 minutes in review of e-visit questionnaire, review and updating patient chart, medical decision making and response to patient.   Thong Feeny M Saban Heinlen, PA-C  

## 2023-02-21 ENCOUNTER — Telehealth: Payer: Medicaid Other | Admitting: Physician Assistant

## 2023-02-21 DIAGNOSIS — N760 Acute vaginitis: Secondary | ICD-10-CM

## 2023-02-22 NOTE — Progress Notes (Signed)
Because you have been treated for vaginitis via e-visit within the past month and are having recurrence of symptoms, including a yellow/green discharge which is not typical of a yeast infection or bv, I feel your condition warrants further evaluation and I recommend that you be seen in a face to face visit.   NOTE: There will be NO CHARGE for this eVisit   If you are having a true medical emergency please call 911.      For an urgent face to face visit, [place] has eight urgent care centers for your convenience:   NEW!! [facility] at [facility] Get Driving Directions [phone] [address] [place], [address]    [facility]gent Care Center at [facility] Get Driving Directions [phone] [address] [place], [place] [address]   [facility]) Get Driving Directions [phone] [address] [place], [place] [address]  [facility] Center [facility] [place]) Get Driving Directions [phone] [address] [place],  [place]  [address]  [facility] Ur[facility] Center [facility] - at [employer]  [phone] [address] [place],  [place] [address]   [facility] Health Urgent Care at [facility] Get Driving Directions [phone] [address] [place] [address] [place], [place] [address]   [facility] Health Urgent Care at [facility] Get Driving Directions  [phone] [address] [place], [place] [address]   [facility] Health Urgent Care at [facility] Directions [phone] [address] [place], [place] [address]  Your MyChart E-visit questionnaire answers were reviewed by a board certified advanced clinical practitioner to complete your personal care plan based on your specific symptoms.  Thank you for using e-Visits.

## 2023-02-24 ENCOUNTER — Other Ambulatory Visit: Payer: Self-pay | Admitting: *Deleted

## 2023-02-24 ENCOUNTER — Encounter: Payer: Self-pay | Admitting: Obstetrics and Gynecology

## 2023-02-24 MED ORDER — FLUCONAZOLE 150 MG PO TABS: 150.0000 mg | ORAL_TABLET | Freq: Once | ORAL | 3 refills | Status: AC

## 2023-03-10 ENCOUNTER — Ambulatory Visit: Payer: Medicaid Other | Admitting: Obstetrics and Gynecology

## 2023-03-24 ENCOUNTER — Other Ambulatory Visit (HOSPITAL_COMMUNITY)
Admission: RE | Admit: 2023-03-24 | Discharge: 2023-03-24 | Disposition: A | Discharge status: Home / Self Care | Payer: Medicaid Other | Source: Ambulatory Visit | Attending: Obstetrics & Gynecology | Admitting: Obstetrics & Gynecology

## 2023-03-24 ENCOUNTER — Ambulatory Visit (INDEPENDENT_AMBULATORY_CARE_PROVIDER_SITE_OTHER): Payer: Self-pay

## 2023-03-24 ENCOUNTER — Ambulatory Visit: Payer: Medicaid Other | Admitting: Obstetrics & Gynecology

## 2023-03-24 VITALS — BP 109/76 | HR 80 | Wt 139.0 lb

## 2023-03-24 DIAGNOSIS — N760 Acute vaginitis: Secondary | ICD-10-CM | POA: Insufficient documentation

## 2023-03-24 DIAGNOSIS — Z113 Encounter for screening for infections with a predominantly sexual mode of transmission: Secondary | ICD-10-CM

## 2023-03-24 DIAGNOSIS — N898 Other specified noninflammatory disorders of vagina: Secondary | ICD-10-CM

## 2023-03-24 DIAGNOSIS — B9689 Other specified bacterial agents as the cause of diseases classified elsewhere: Secondary | ICD-10-CM

## 2023-03-24 NOTE — Progress Notes (Signed)
SUBJECTIVE:  22 y.o. female who desires a STI screen. Pt notes spotting/discharge and lower abdominal pain.  No LMP recorded.  OBJECTIVE:  She appears well.   ASSESSMENT:  STI Screen   PLAN:  Pt offered STI blood screening-not indicated GC, chlamydia,BV, yeast and trichomonas probe sent to lab.  Treatment: To be determined once lab results are received.  Pt follow up as needed.

## 2023-03-25 LAB — CERVICOVAGINAL ANCILLARY ONLY
Bacterial Vaginitis (gardnerella): POSITIVE — AB
Candida Glabrata: NEGATIVE
Candida Vaginitis: NEGATIVE
Chlamydia: NEGATIVE
Comment: NEGATIVE
Comment: NEGATIVE
Comment: NEGATIVE
Comment: NEGATIVE
Comment: NEGATIVE
Comment: NORMAL
Neisseria Gonorrhea: NEGATIVE
Trichomonas: NEGATIVE

## 2023-03-25 MED ORDER — METRONIDAZOLE 500 MG PO TABS
500.0000 mg | ORAL_TABLET | Freq: Two times a day (BID) | ORAL | 0 refills | Status: DC
Start: 2023-03-25 — End: 2023-05-26

## 2023-03-25 NOTE — Addendum Note (Signed)
Addended by: Jaynie Collins A on: 03/25/2023 02:14 PM   Modules accepted: Orders

## 2023-04-01 ENCOUNTER — Encounter: Payer: Self-pay | Admitting: Obstetrics and Gynecology

## 2023-04-02 ENCOUNTER — Telehealth: Payer: Self-pay | Admitting: Family Medicine

## 2023-04-02 DIAGNOSIS — B3731 Acute candidiasis of vulva and vagina: Secondary | ICD-10-CM

## 2023-04-02 MED ORDER — FLUCONAZOLE 150 MG PO TABS: 150.0000 mg | ORAL_TABLET | Freq: Once | ORAL | 0 refills | Status: AC

## 2023-04-02 NOTE — Progress Notes (Signed)

## 2023-05-25 ENCOUNTER — Telehealth: Payer: Self-pay | Admitting: Physician Assistant

## 2023-05-25 DIAGNOSIS — A084 Viral intestinal infection, unspecified: Secondary | ICD-10-CM

## 2023-05-25 MED ORDER — ONDANSETRON 4 MG PO TBDP
4.0000 mg | ORAL_TABLET | Freq: Three times a day (TID) | ORAL | 0 refills | Status: DC | PRN
Start: 2023-05-25 — End: 2023-10-12

## 2023-05-25 NOTE — Progress Notes (Signed)

## 2023-05-25 NOTE — Progress Notes (Signed)
I have spent 5 minutes in review of e-visit questionnaire, review and updating patient chart, medical decision making and response to patient.   Mia Milan Cody Jacklynn Dehaas, PA-C    

## 2023-05-26 ENCOUNTER — Telehealth: Payer: Self-pay | Admitting: Family Medicine

## 2023-05-26 ENCOUNTER — Other Ambulatory Visit (INDEPENDENT_AMBULATORY_CARE_PROVIDER_SITE_OTHER): Payer: Self-pay | Admitting: Physician Assistant

## 2023-05-26 DIAGNOSIS — N760 Acute vaginitis: Secondary | ICD-10-CM

## 2023-05-26 DIAGNOSIS — B3731 Acute candidiasis of vulva and vagina: Secondary | ICD-10-CM

## 2023-05-26 MED ORDER — METRONIDAZOLE 500 MG PO TABS
500.0000 mg | ORAL_TABLET | Freq: Two times a day (BID) | ORAL | 0 refills | Status: AC
Start: 2023-05-26 — End: 2023-06-02

## 2023-05-26 MED ORDER — FLUCONAZOLE 150 MG PO TABS
150.0000 mg | ORAL_TABLET | Freq: Every day | ORAL | 0 refills | Status: DC
Start: 2023-05-26 — End: 2023-09-18

## 2023-05-26 NOTE — Progress Notes (Signed)
E-Visit for Vaginal Symptoms  We are sorry that you are not feeling well. Here is how we plan to help! Based on what you shared with me it looks like you: May have a vaginosis due to bacteria and yeast  Vaginosis is an inflammation of the vagina that can result in discharge, itching and pain. The cause is usually a change in the normal balance of vaginal bacteria or an infection. Vaginosis can also result from reduced estrogen levels after menopause.  The most common causes of vaginosis are:   Bacterial vaginosis which results from an overgrowth of one on several organisms that are normally present in your vagina.   Yeast infections which are caused by a naturally occurring fungus called candida.   Vaginal atrophy (atrophic vaginosis) which results from the thinning of the vagina from reduced estrogen levels after menopause.   Trichomoniasis which is caused by a parasite and is commonly transmitted by sexual intercourse.  Factors that increase your risk of developing vaginosis include: Medications, such as antibiotics and steroids Uncontrolled diabetes Use of hygiene products such as bubble bath, vaginal spray or vaginal deodorant Douching Wearing damp or tight-fitting clothing Using an intrauterine device (IUD) for birth control Hormonal changes, such as those associated with pregnancy, birth control pills or menopause Sexual activity Having a sexually transmitted infection  Your treatment plan is Metronidazole or Flagyl 500mg  twice a day for 7 days.  I have electronically sent this prescription into the pharmacy that you have chosen. As well as Diflucan 150mg  times once.   Be sure to take all of the medication as directed. Stop taking any medication if you develop a rash, tongue swelling or shortness of breath. Mothers who are breast feeding should consider pumping and discarding their breast milk while on these antibiotics. However, there is no consensus that infant exposure at  these doses would be harmful.  Remember that medication creams can weaken latex condoms. [place] Kitchen   HOME CARE:  Good hygiene may prevent some types of vaginosis from recurring and may relieve some symptoms:  Avoid baths, hot tubs and whirlpool spas. Rinse soap from your outer genital area after a shower, and dry the area well to prevent irritation. Don't use scented or harsh soaps, such as those with deodorant or antibacterial action. Avoid irritants. These include scented tampons and pads. Wipe from front to back after using the toilet. Doing so avoids spreading fecal bacteria to your vagina.  Other things that may help prevent vaginosis include:  Don't douche. Your vagina doesn't require cleansing other than normal bathing. Repetitive douching disrupts the normal organisms that reside in the vagina and can actually increase your risk of vaginal infection. Douching won't clear up a vaginal infection. Use a latex condom. Both female and female latex condoms may help you avoid infections spread by sexual contact. Wear cotton underwear. Also wear pantyhose with a cotton crotch. If you feel comfortable without it, skip wearing underwear to bed. Yeast thrives in [employer] Your symptoms should improve in the next day or two.  GET HELP RIGHT AWAY IF:  You have pain in your lower abdomen ( pelvic area or over your ovaries) You develop nausea or vomiting You develop a fever Your discharge changes or worsens You have persistent pain with intercourse You develop shortness of breath, a rapid pulse, or you faint.  These symptoms could be signs of problems or infections that need to be evaluated by a medical provider now.  MAKE SURE YOU   Understand  these instructions. Will watch your condition. Will get help right away if you are not doing well or get worse.  Thank you for choosing an e-visit.  Your e-visit answers were reviewed by a board certified advanced clinical practitioner to  complete your personal care plan. Depending upon the condition, your plan could have included both over the counter or prescription medications.  Please review your pharmacy choice. Make sure the pharmacy is open so you can pick up prescription now. If there is a problem, you may contact your provider through [employer] and have the prescription routed to another pharmacy.  Your safety is important to [place]. If you have drug allergies check your prescription carefully.   For the next 24 hours you can use MyChart to ask questions about today's visit, request a non-urgent call back, or ask for a work or school excuse. You will get an email in the next two days asking about your experience. I hope that your e-visit has been valuable and will speed your recovery.  I provided 5 minutes of non face-to-face time during this encounter for chart review, medication and order placement, as well as and documentation.

## 2023-08-27 ENCOUNTER — Ambulatory Visit (INDEPENDENT_AMBULATORY_CARE_PROVIDER_SITE_OTHER): Payer: Self-pay

## 2023-08-27 ENCOUNTER — Other Ambulatory Visit (HOSPITAL_COMMUNITY)
Admission: RE | Admit: 2023-08-27 | Discharge: 2023-08-27 | Disposition: A | Discharge status: Home / Self Care | Payer: Medicaid Other | Source: Ambulatory Visit | Attending: Family Medicine | Admitting: Family Medicine

## 2023-08-27 DIAGNOSIS — Z113 Encounter for screening for infections with a predominantly sexual mode of transmission: Secondary | ICD-10-CM

## 2023-08-27 DIAGNOSIS — N898 Other specified noninflammatory disorders of vagina: Secondary | ICD-10-CM

## 2023-08-27 NOTE — Progress Notes (Signed)
SUBJECTIVE:  22 y.o. female who desires a STI screen. Does note abnormal vaginal discharge, denies bleeding or significant pelvic pain. No UTI symptoms. Denies history of known exposure to STD.  No LMP recorded.  OBJECTIVE:  She appears well.   ASSESSMENT:  STI Screen   PLAN:  Pt offered STI blood screening-declined GC, chlamydia, and trichomonas probe sent to lab.  Treatment: To be determined once lab results are received.  Pt follow up as needed.

## 2023-08-31 LAB — CERVICOVAGINAL ANCILLARY ONLY
Bacterial Vaginitis (gardnerella): NEGATIVE
Candida Glabrata: NEGATIVE
Candida Vaginitis: NEGATIVE
Chlamydia: NEGATIVE
Comment: NEGATIVE
Comment: NEGATIVE
Comment: NEGATIVE
Comment: NEGATIVE
Comment: NEGATIVE
Comment: NORMAL
Neisseria Gonorrhea: NEGATIVE
Trichomonas: NEGATIVE

## 2023-09-16 ENCOUNTER — Telehealth: Payer: BC Managed Care – PPO | Admitting: Family Medicine

## 2023-09-16 DIAGNOSIS — K0889 Other specified disorders of teeth and supporting structures: Secondary | ICD-10-CM

## 2023-09-16 DIAGNOSIS — B999 Unspecified infectious disease: Secondary | ICD-10-CM

## 2023-09-16 NOTE — Progress Notes (Signed)
 Because you are having swelling, a lot of pain, and nausea we need to have you seen in person, to check for infections that are in the bone and not just the tooth. Your condition warrants further evaluation and I recommend that you be seen in a face to face visit PCP office or local urgent care.   NOTE: There will be NO CHARGE for this eVisit   If you are having a true medical emergency please call 911.

## 2023-09-18 ENCOUNTER — Telehealth: Payer: BC Managed Care – PPO | Admitting: Family Medicine

## 2023-09-18 DIAGNOSIS — N760 Acute vaginitis: Secondary | ICD-10-CM | POA: Diagnosis not present

## 2023-09-18 MED ORDER — FLUCONAZOLE 150 MG PO TABS
150.0000 mg | ORAL_TABLET | Freq: Once | ORAL | 0 refills | Status: AC
Start: 2023-09-18 — End: 2023-09-18

## 2023-09-18 NOTE — Progress Notes (Signed)

## 2023-09-24 ENCOUNTER — Other Ambulatory Visit: Payer: Self-pay

## 2023-09-24 DIAGNOSIS — N898 Other specified noninflammatory disorders of vagina: Secondary | ICD-10-CM

## 2023-09-24 MED ORDER — METRONIDAZOLE 500 MG PO TABS: 500.0000 mg | ORAL_TABLET | Freq: Two times a day (BID) | ORAL | 1 refills | Status: DC

## 2023-09-24 NOTE — Progress Notes (Signed)
Pt requested Rx for BV .  Rx sent per protocol for BV .  Pt made aware

## 2023-10-12 ENCOUNTER — Ambulatory Visit: Payer: BC Managed Care – PPO | Admitting: Obstetrics and Gynecology

## 2023-10-12 VITALS — BP 128/77 | HR 88 | Wt 153.0 lb

## 2023-10-12 DIAGNOSIS — Z3046 Encounter for surveillance of implantable subdermal contraceptive: Secondary | ICD-10-CM

## 2023-10-12 NOTE — Progress Notes (Signed)
CC: nexplanon removal   Wants to give body break from birth control  Not sexually active

## 2023-10-12 NOTE — Progress Notes (Signed)
   GYNECOLOGY OFFICE PROCEDURE NOTE  Sarah Spears is a 23 y.o. G0P0000 here for Nexplanon  removal. No major issues but she is not sexually active and wants to see how her body does off birth control.  No other gynecologic concerns.  Nexplanon  Removal Patient identified, informed consent performed, consent signed.   Appropriate time out taken.   Nexplanon  site identified.  Area prepped in usual sterile fashon. One ml of 1% lidocaine with epi was used to anesthetize the area at the distal end of the implant. A small stab incision was made right beside the implant on the distal portion. Capsule was broken up with sharp dissection and Nexplanon  was removed intact using pop out technique. There was minimal blood loss. There were no complications.  Steri-strips were applied over the small incision.  A pressure bandage was applied to reduce any bruising.    The patient tolerated the procedure well and was given post procedure instructions.  Kieth Carolin, MD Obstetrician & Gynecologist, [facility] for [employer], [facility]

## 2023-10-19 ENCOUNTER — Other Ambulatory Visit: Payer: Self-pay | Admitting: *Deleted

## 2023-10-19 MED ORDER — METRONIDAZOLE 0.75 % VA GEL: 1.0000 | Freq: Every day | VAGINAL | 1 refills | Status: DC

## 2023-11-03 ENCOUNTER — Other Ambulatory Visit: Payer: Self-pay | Admitting: *Deleted

## 2023-11-03 MED ORDER — FLUCONAZOLE 150 MG PO TABS
150.0000 mg | ORAL_TABLET | Freq: Once | ORAL | 3 refills | Status: DC
Start: 2023-11-03 — End: 2024-02-25

## 2023-11-15 ENCOUNTER — Ambulatory Visit: Payer: Self-pay

## 2023-11-18 ENCOUNTER — Other Ambulatory Visit (HOSPITAL_COMMUNITY)
Admission: RE | Admit: 2023-11-18 | Discharge: 2023-11-18 | Disposition: A | Discharge status: Home / Self Care | Payer: Self-pay | Source: Ambulatory Visit | Attending: Obstetrics & Gynecology | Admitting: Obstetrics & Gynecology

## 2023-11-18 ENCOUNTER — Ambulatory Visit: Payer: Self-pay

## 2023-11-18 DIAGNOSIS — Z113 Encounter for screening for infections with a predominantly sexual mode of transmission: Secondary | ICD-10-CM | POA: Insufficient documentation

## 2023-11-18 NOTE — Progress Notes (Signed)
 SUBJECTIVE:  23 y.o. female who desires a STI screen. Notes vaginal irritation, no  bleeding or significant pelvic pain. No UTI symptoms. Denies history of known exposure to STD.  No LMP recorded.  OBJECTIVE:  She appears well.   ASSESSMENT:  STI Screen   PLAN:  Pt offered STI blood screening-not indicated GC, chlamydia, BV , yeast and trichomonas probe sent to lab.  Treatment: To be determined once lab results are received.  Pt follow up as needed.

## 2023-11-19 LAB — CERVICOVAGINAL ANCILLARY ONLY
Bacterial Vaginitis (gardnerella): NEGATIVE
Candida Glabrata: NEGATIVE
Candida Vaginitis: POSITIVE — AB
Chlamydia: NEGATIVE
Comment: NEGATIVE
Comment: NEGATIVE
Comment: NEGATIVE
Comment: NEGATIVE
Comment: NEGATIVE
Comment: NORMAL
Neisseria Gonorrhea: NEGATIVE
Trichomonas: NEGATIVE

## 2023-11-20 ENCOUNTER — Encounter: Payer: Self-pay | Admitting: Obstetrics & Gynecology

## 2023-11-20 MED ORDER — FLUCONAZOLE 150 MG PO TABS: 150.0000 mg | ORAL_TABLET | Freq: Once | ORAL | 3 refills | Status: DC

## 2023-11-20 NOTE — Addendum Note (Signed)
 Addended by: Jaynie Collins A on: 11/20/2023 04:33 PM   Modules accepted: Orders

## 2023-11-23 ENCOUNTER — Other Ambulatory Visit: Payer: Self-pay | Admitting: *Deleted

## 2023-11-23 DIAGNOSIS — Z113 Encounter for screening for infections with a predominantly sexual mode of transmission: Secondary | ICD-10-CM

## 2023-11-23 MED ORDER — FLUCONAZOLE 150 MG PO TABS: 150.0000 mg | ORAL_TABLET | Freq: Once | ORAL | 3 refills | Status: AC

## 2024-01-14 ENCOUNTER — Other Ambulatory Visit: Payer: Self-pay | Admitting: Family Medicine

## 2024-02-10 ENCOUNTER — Ambulatory Visit

## 2024-02-21 ENCOUNTER — Ambulatory Visit

## 2024-02-29 ENCOUNTER — Other Ambulatory Visit (HOSPITAL_COMMUNITY)
Admission: RE | Admit: 2024-02-29 | Discharge: 2024-02-29 | Disposition: A | Discharge status: Home / Self Care | Source: Ambulatory Visit | Attending: Family Medicine | Admitting: Family Medicine

## 2024-02-29 ENCOUNTER — Ambulatory Visit

## 2024-02-29 DIAGNOSIS — N898 Other specified noninflammatory disorders of vagina: Secondary | ICD-10-CM | POA: Diagnosis not present

## 2024-02-29 NOTE — Progress Notes (Signed)
 SUBJECTIVE:  23 y.o. female complains of  vaginal discharge /Irritation. Denies abnormal vaginal bleeding or significant pelvic pain or fever. No UTI symptoms. Denies history of known exposure to STD.  No LMP recorded.  OBJECTIVE:  She appears well, afebrile. Urine dipstick: not done.  ASSESSMENT:  Vaginal Discharge     PLAN:  GC, chlamydia, trichomonas, BVAG, CVAG probe sent to lab. Treatment: To be determined once lab results are received ROV prn if symptoms persist or worsen.

## 2024-03-01 LAB — CERVICOVAGINAL ANCILLARY ONLY
Bacterial Vaginitis (gardnerella): NEGATIVE
Candida Glabrata: NEGATIVE
Candida Vaginitis: NEGATIVE
Chlamydia: NEGATIVE
Comment: NEGATIVE
Comment: NEGATIVE
Comment: NEGATIVE
Comment: NEGATIVE
Comment: NEGATIVE
Comment: NORMAL
Neisseria Gonorrhea: NEGATIVE
Trichomonas: NEGATIVE

## 2024-03-27 ENCOUNTER — Telehealth: Admitting: Family Medicine

## 2024-03-27 DIAGNOSIS — K0889 Other specified disorders of teeth and supporting structures: Secondary | ICD-10-CM

## 2024-03-27 NOTE — Progress Notes (Signed)
  Because the pain, location, chewing trouble, and talking trouble, and swelling we feel your condition warrants further evaluation and we recommend that you be seen in a face-to-face visit.   NOTE: There will be NO CHARGE for this E-Visit

## 2024-05-12 ENCOUNTER — Ambulatory Visit (INDEPENDENT_AMBULATORY_CARE_PROVIDER_SITE_OTHER)

## 2024-05-12 ENCOUNTER — Other Ambulatory Visit (HOSPITAL_COMMUNITY)
Admission: RE | Admit: 2024-05-12 | Discharge: 2024-05-12 | Disposition: A | Discharge status: Home / Self Care | Source: Ambulatory Visit | Attending: Family Medicine | Admitting: Family Medicine

## 2024-05-12 DIAGNOSIS — N760 Acute vaginitis: Secondary | ICD-10-CM | POA: Diagnosis not present

## 2024-05-12 NOTE — Progress Notes (Signed)
 SUBJECTIVE:  23 y.o. female who desires a STI screen. C/O vaginal discharge, no bleeding or significant pelvic pain. No UTI symptoms. Pt notes PH balance may be off.  No LMP recorded.  OBJECTIVE:  She appears well.   ASSESSMENT:  STI Screen   PLAN:  Pt offered STI blood screening-not indicated GC, chlamydia, BV,Yeast and trichomonas probe sent to lab.  Treatment: To be determined once lab results are received.  Pt follow up as needed.

## 2024-05-13 ENCOUNTER — Encounter

## 2024-05-13 ENCOUNTER — Telehealth: Admitting: Family

## 2024-05-13 DIAGNOSIS — L259 Unspecified contact dermatitis, unspecified cause: Secondary | ICD-10-CM | POA: Diagnosis not present

## 2024-05-13 MED ORDER — TRIAMCINOLONE ACETONIDE 0.5 % EX OINT
1.0000 | TOPICAL_OINTMENT | Freq: Two times a day (BID) | CUTANEOUS | 0 refills | Status: DC
Start: 2024-05-13 — End: 2024-07-05

## 2024-05-13 NOTE — Progress Notes (Signed)
Approximately 5 minutes was spent documenting and reviewing patient's chart.

## 2024-05-13 NOTE — Progress Notes (Signed)
 E Visit for Rash  We are sorry that you are not feeling well. Here is how we plan to help!  Based on what you shared with me it looks like you have contact dermatitis.  Contact dermatitis is a skin rash caused by something that touches the skin and causes irritation or inflammation.  Your skin may be red, swollen, dry, cracked, and itch.  The rash should go away in a few days but can last a few weeks.  If you get a rash, it's important to figure out what caused it so the irritant can be avoided in the future. and I am prescribing triamcinolone  0.1 % cream -- apply to the affected area(s) in a thin layer, twice daily for up to 14 days. Do not apply to face, privates or armpit regions.   HOME CARE:  Take cool showers and avoid direct sunlight. Apply cool compress or wet dressings. Take a bath in an oatmeal bath.  Sprinkle content of one Aveeno packet under running faucet with comfortably warm water .  Bathe for 15-20 minutes, 1-2 times daily.  Pat dry with a towel. Do not rub the rash. Use hydrocortisone  cream. Take an antihistamine like Benadryl  for widespread rashes that itch.  The adult dose of Benadryl  is 25-50 mg by mouth 4 times daily. Caution:  This type of medication may cause sleepiness.  Do not drink alcohol , drive, or operate dangerous machinery while taking antihistamines.  Do not take these medications if you have prostate enlargement.  Read package instructions thoroughly on all medications that you take.  GET HELP RIGHT AWAY IF:  Symptoms don't go away after treatment. Severe itching that persists. If you rash spreads or swells. If you rash begins to smell. If it blisters and opens or develops a yellow-brown crust. You develop a fever. You have a sore throat. You become short of breath.  MAKE SURE YOU:  Understand these instructions. Will watch your condition. Will get help right away if you are not doing well or get worse.  Thank you for choosing an e-visit.  Your  e-visit answers were reviewed by a board certified advanced clinical practitioner to complete your personal care plan. Depending upon the condition, your plan could have included both over the counter or prescription medications.  Please review your pharmacy choice. Make sure the pharmacy is open so you can pick up prescription now. If there is a problem, you may contact your provider through [employer] and have the prescription routed to another pharmacy.  Your safety is important to us . If you have drug allergies check your prescription carefully.   For the next 24 hours you can use MyChart to ask questions about today's visit, request a non-urgent call back, or ask for a work or school excuse. You will get an email in the next two days asking about your experience. I hope that your e-visit has been valuable and will speed your recovery.

## 2024-05-15 LAB — CERVICOVAGINAL ANCILLARY ONLY
Bacterial Vaginitis (gardnerella): NEGATIVE
Candida Glabrata: NEGATIVE
Candida Vaginitis: NEGATIVE
Chlamydia: NEGATIVE
Comment: NEGATIVE
Comment: NEGATIVE
Comment: NEGATIVE
Comment: NEGATIVE
Comment: NEGATIVE
Comment: NORMAL
Neisseria Gonorrhea: NEGATIVE
Trichomonas: NEGATIVE

## 2024-05-16 ENCOUNTER — Ambulatory Visit: Payer: Self-pay | Admitting: Family Medicine

## 2024-05-31 ENCOUNTER — Ambulatory Visit: Admitting: Obstetrics and Gynecology

## 2024-06-22 ENCOUNTER — Telehealth: Payer: Self-pay | Admitting: *Deleted

## 2024-06-22 NOTE — Telephone Encounter (Signed)
 erroneous

## 2024-07-05 ENCOUNTER — Inpatient Hospital Stay (HOSPITAL_COMMUNITY)
Admission: AD | Admit: 2024-07-05 | Discharge: 2024-07-05 | Disposition: A | Discharge status: Home / Self Care | Attending: Obstetrics and Gynecology | Admitting: Obstetrics and Gynecology

## 2024-07-05 ENCOUNTER — Encounter (HOSPITAL_COMMUNITY): Payer: Self-pay | Admitting: Obstetrics and Gynecology

## 2024-07-05 ENCOUNTER — Inpatient Hospital Stay (HOSPITAL_COMMUNITY)

## 2024-07-05 DIAGNOSIS — N8311 Corpus luteum cyst of right ovary: Secondary | ICD-10-CM | POA: Diagnosis not present

## 2024-07-05 DIAGNOSIS — Z7722 Contact with and (suspected) exposure to environmental tobacco smoke (acute) (chronic): Secondary | ICD-10-CM | POA: Diagnosis not present

## 2024-07-05 DIAGNOSIS — Z3A01 Less than 8 weeks gestation of pregnancy: Secondary | ICD-10-CM

## 2024-07-05 DIAGNOSIS — O3481 Maternal care for other abnormalities of pelvic organs, first trimester: Secondary | ICD-10-CM | POA: Diagnosis not present

## 2024-07-05 DIAGNOSIS — R109 Unspecified abdominal pain: Secondary | ICD-10-CM

## 2024-07-05 DIAGNOSIS — O26891 Other specified pregnancy related conditions, first trimester: Secondary | ICD-10-CM

## 2024-07-05 DIAGNOSIS — Z113 Encounter for screening for infections with a predominantly sexual mode of transmission: Secondary | ICD-10-CM | POA: Diagnosis present

## 2024-07-05 DIAGNOSIS — R0602 Shortness of breath: Secondary | ICD-10-CM | POA: Diagnosis present

## 2024-07-05 LAB — POCT PREGNANCY, URINE: Preg Test, Ur: POSITIVE — AB

## 2024-07-05 LAB — ABO/RH: ABO/RH(D): O POS

## 2024-07-05 LAB — CBC
HCT: 35.2 % — ABNORMAL LOW (ref 36.0–46.0)
Hemoglobin: 11.6 g/dL — ABNORMAL LOW (ref 12.0–15.0)
MCH: 28 pg (ref 26.0–34.0)
MCHC: 33 g/dL (ref 30.0–36.0)
MCV: 84.8 fL (ref 80.0–100.0)
Platelets: 258 K/uL (ref 150–400)
RBC: 4.15 MIL/uL (ref 3.87–5.11)
RDW: 14.7 % (ref 11.5–15.5)
WBC: 11 K/uL — ABNORMAL HIGH (ref 4.0–10.5)
nRBC: 0 % (ref 0.0–0.2)

## 2024-07-05 LAB — COMPREHENSIVE METABOLIC PANEL WITH GFR
ALT: 11 U/L (ref 0–44)
AST: 15 U/L (ref 15–41)
Albumin: 3.7 g/dL (ref 3.5–5.0)
Alkaline Phosphatase: 38 U/L (ref 38–126)
Anion gap: 7 (ref 5–15)
BUN: 14 mg/dL (ref 6–20)
CO2: 25 mmol/L (ref 22–32)
Calcium: 8.7 mg/dL — ABNORMAL LOW (ref 8.9–10.3)
Chloride: 105 mmol/L (ref 98–111)
Creatinine, Ser: 0.65 mg/dL (ref 0.44–1.00)
GFR, Estimated: >60 mL/min (ref >60)
Glucose, Bld: 85 mg/dL (ref 70–99)
Potassium: 3.8 mmol/L (ref 3.5–5.1)
Sodium: 137 mmol/L (ref 135–145)
Total Bilirubin: 0.5 mg/dL (ref 0.0–1.2)
Total Protein: 6.4 g/dL — ABNORMAL LOW (ref 6.5–8.1)

## 2024-07-05 LAB — URINALYSIS, ROUTINE W REFLEX MICROSCOPIC
Bilirubin Urine: NEGATIVE
Glucose, UA: NEGATIVE mg/dL
Hgb urine dipstick: NEGATIVE
Ketones, ur: NEGATIVE mg/dL
Leukocytes,Ua: NEGATIVE
Nitrite: NEGATIVE
Protein, ur: NEGATIVE mg/dL
Specific Gravity, Urine: 1.019 (ref 1.005–1.030)
pH: 5 (ref 5.0–8.0)

## 2024-07-05 LAB — WET PREP, GENITAL
Clue Cells Wet Prep HPF POC: NONE SEEN
Sperm: NONE SEEN
Trich, Wet Prep: NONE SEEN
WBC, Wet Prep HPF POC: <10 (ref <10)
Yeast Wet Prep HPF POC: NONE SEEN

## 2024-07-05 LAB — HCG, QUANTITATIVE, PREGNANCY: hCG, Beta Chain, Quant, S: 92826 m[IU]/mL — ABNORMAL HIGH (ref <5)

## 2024-07-05 MED ORDER — ACETAMINOPHEN 325 MG PO TABS
650.0000 mg | ORAL_TABLET | Freq: Once | ORAL | Status: AC
  Administered 2024-07-05: 650 mg via ORAL
  Filled 2024-07-05: qty 2

## 2024-07-05 NOTE — MAU Note (Signed)
..  Sarah Spears is a 23 y.o. at Unknown here in MAU reporting: cramping that began 4 days ago, denies vaginal bleeding. Has not taken anything for the cramping.  Reports for the past couple days shortness of breath, hot flashes and weakness. Reports nausea that she takes ginger chews for.  Has an appointment at [facility] on 07/11/2024 LMP: 05/13/2024 Pain score: 4/10 Vitals:   07/05/24 1929  BP: 121/68  Pulse: 93  Resp: 17  Temp: 98.6 F (37 C)  SpO2: 100%     FHT:n/a Lab orders placed from triage:  POCT pregnancy, UA

## 2024-07-05 NOTE — MAU Provider Note (Signed)
 History     CSN: [id number]  Arrival date and time: 07/05/24 8162   Event Date/Time   First Provider Initiated Contact with Patient 07/05/24 2032      Chief Complaint  Patient presents with   Abdominal Pain    Sarah Spears is a 23 y.o. G1P0 at [redacted]w[redacted]d by definite LMP of Sept 6, 2025 who receives care at [facility].  She presents today for cramping and abdominal pain.  She reports cramping started 4 days ago and is worse at night or with laying.  She reports the pain is constant when it occurs and lasts 10 minutes.  She rates the pain a 5/10 currently.   She reports various complaints that include lightheadedness, SOB, hot flashes, feelings of syncope, and nausea.   Patient informed that these symptoms can be contributed to inadequate nutritional intake and e  She reports SOB that occurs with walking and has been present since discovering she was pregnant.  She does report she lives in a home with tobacco smokers. She denies h/o asthma.   OB History     Gravida  1   Para  0   Term  0   Preterm  0   AB  0   Living  0      SAB  0   IAB  0   Ectopic  0   Multiple  0   Live Births  0           Past Medical History:  Diagnosis Date   ADHD (attention deficit hyperactivity disorder)    Anxiety    Chlamydia    COVID-19 2022   Depression    Headache    Seasonal allergies     Past Surgical History:  Procedure Laterality Date   REMOVAL OF IMPLANON  ROD  07/08/2021    Family History  Problem Relation Age of Onset   Bipolar disorder Mother    Anxiety disorder Mother    Drug abuse Mother    Depression Mother    ADD / ADHD Mother    Seizures Sister    Bipolar disorder Maternal Aunt    Anxiety disorder Maternal Aunt    Depression Maternal Aunt    Alcohol abuse Maternal Aunt    Drug abuse Maternal Aunt    Post-traumatic stress disorder Maternal Aunt    Bipolar disorder Maternal Grandfather    Anxiety disorder Maternal Grandfather    Depression Maternal  Grandfather    Post-traumatic stress disorder Maternal Grandmother    Depression Maternal Grandmother    Bipolar disorder Maternal Grandmother    Anxiety disorder Maternal Grandmother     Social History   Tobacco Use   Smoking status: Never    Passive exposure: Past   Smokeless tobacco: Never  Vaping Use   Vaping status: Never Used  Substance Use Topics   Alcohol use: No   Drug use: No    Allergies:  Allergies  Allergen Reactions   Lactose Intolerance (Gi)     Medications Prior to Admission  Medication Sig Dispense Refill Last Dose/Taking   fluconazole  (DIFLUCAN ) 150 MG tablet TAKE 1 TABLET (150 MG TOTAL) BY MOUTH ONCE FOR 1 DOSE. MAY REPEAT 3 DAYS LATER IF SYMPTOMS PERSIST 1 tablet 3    metroNIDAZOLE  (METROGEL ) 0.75 % vaginal gel Place 1 Applicatorful vaginally at bedtime. Apply one applicatorful to vagina at bedtime for 5 days 70 g 1    triamcinolone  ointment (KENALOG ) 0.5 % Apply 1 Application topically 2 (two) times  daily. 30 g 0     Review of Systems  Constitutional:  Negative for chills and fever.  Respiratory:  Positive for shortness of breath.   Gastrointestinal:  Positive for abdominal pain and nausea. Negative for constipation, diarrhea and vomiting.  Genitourinary:  Negative for difficulty urinating, dysuria, vaginal bleeding and vaginal discharge.  Neurological:  Positive for light-headedness. Negative for dizziness and headaches.   Physical Exam   Blood pressure 121/68, pulse 93, temperature 98.6 F (37 C), temperature source Oral, resp. rate 17, height 5' 2 (1.575 m), weight 76.8 kg, last menstrual period 05/13/2024, SpO2 100%.  Physical Exam  MAU Course  Procedures Results for orders placed or performed during the hospital encounter of 07/05/24 (from the past 24 hours)  Urinalysis, Routine w reflex microscopic -Urine, Clean Catch     Status: None   Collection Time: 07/05/24  7:16 PM  Result Value Ref Range   Color, Urine YELLOW YELLOW   APPearance  CLEAR CLEAR   Specific Gravity, Urine 1.019 1.005 - 1.030   pH 5.0 5.0 - 8.0   Glucose, UA NEGATIVE NEGATIVE mg/dL   Hgb urine dipstick NEGATIVE NEGATIVE   Bilirubin Urine NEGATIVE NEGATIVE   Ketones, ur NEGATIVE NEGATIVE mg/dL   Protein, ur NEGATIVE NEGATIVE mg/dL   Nitrite NEGATIVE NEGATIVE   Leukocytes,Ua NEGATIVE NEGATIVE  Pregnancy, urine POC     Status: Abnormal   Collection Time: 07/05/24  7:21 PM  Result Value Ref Range   Preg Test, Ur POSITIVE (A) NEGATIVE    MDM Physical Exam Cultures: Wet Prep and GC/CT Labs: UA, UPT, CBC, CMP, hCG, ABO Ultrasound Assessment and Plan  ***  [name] 07/05/2024, 8:32 PM   Reassessment (9:59 PM)

## 2024-07-06 NOTE — Discharge Instructions (Signed)

## 2024-07-07 ENCOUNTER — Encounter: Payer: Self-pay | Admitting: Obstetrics & Gynecology

## 2024-07-07 ENCOUNTER — Encounter: Payer: Self-pay | Admitting: *Deleted

## 2024-07-07 LAB — GC/CHLAMYDIA PROBE AMP (~~LOC~~) NOT AT ARMC
Chlamydia: NEGATIVE
Comment: NEGATIVE
Comment: NORMAL
Neisseria Gonorrhea: NEGATIVE

## 2024-07-11 ENCOUNTER — Encounter

## 2024-07-25 ENCOUNTER — Other Ambulatory Visit (HOSPITAL_COMMUNITY)
Admission: RE | Admit: 2024-07-25 | Discharge: 2024-07-25 | Disposition: A | Discharge status: Home / Self Care | Source: Ambulatory Visit | Attending: Obstetrics & Gynecology | Admitting: Obstetrics & Gynecology

## 2024-07-25 ENCOUNTER — Encounter: Payer: Self-pay | Admitting: Obstetrics & Gynecology

## 2024-07-25 ENCOUNTER — Ambulatory Visit (INDEPENDENT_AMBULATORY_CARE_PROVIDER_SITE_OTHER): Admitting: Obstetrics & Gynecology

## 2024-07-25 VITALS — BP 114/75 | HR 92 | Wt 162.2 lb

## 2024-07-25 DIAGNOSIS — O0990 Supervision of high risk pregnancy, unspecified, unspecified trimester: Secondary | ICD-10-CM | POA: Insufficient documentation

## 2024-07-25 DIAGNOSIS — Z3A10 10 weeks gestation of pregnancy: Secondary | ICD-10-CM

## 2024-07-25 DIAGNOSIS — Z3401 Encounter for supervision of normal first pregnancy, first trimester: Secondary | ICD-10-CM | POA: Diagnosis not present

## 2024-07-25 DIAGNOSIS — Z9189 Other specified personal risk factors, not elsewhere classified: Secondary | ICD-10-CM | POA: Insufficient documentation

## 2024-07-25 DIAGNOSIS — Z3490 Encounter for supervision of normal pregnancy, unspecified, unspecified trimester: Secondary | ICD-10-CM | POA: Insufficient documentation

## 2024-07-25 MED ORDER — ASPIRIN 81 MG PO TBEC: 81.0000 mg | DELAYED_RELEASE_TABLET | Freq: Every day | ORAL | 2 refills | Status: AC
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
## 2024-07-25 NOTE — Addendum Note (Signed)
 Addended by: ELAINE ROSINA SAILOR on: 07/25/2024 04:35 PM   Modules accepted: Orders

## 2024-07-26 ENCOUNTER — Encounter: Payer: Self-pay | Admitting: Obstetrics & Gynecology

## 2024-07-26 ENCOUNTER — Ambulatory Visit: Payer: Self-pay | Admitting: Obstetrics & Gynecology

## 2024-07-26 DIAGNOSIS — Z3401 Encounter for supervision of normal first pregnancy, first trimester: Secondary | ICD-10-CM

## 2024-07-26 LAB — CBC/D/PLT+RPR+RH+ABO+RUBIGG...
Antibody Screen: NEGATIVE
Basophils Absolute: 0 x10E3/uL (ref 0.0–0.2)
Basos: 0 %
EOS (ABSOLUTE): 0.1 x10E3/uL (ref 0.0–0.4)
Eos: 1 %
HCV Ab: NONREACTIVE
HIV Screen 4th Generation wRfx: NONREACTIVE
Hematocrit: 39.4 % (ref 34.0–46.6)
Hemoglobin: 12.7 g/dL (ref 11.1–15.9)
Hepatitis B Surface Ag: NEGATIVE
Immature Grans (Abs): 0 x10E3/uL (ref 0.0–0.1)
Immature Granulocytes: 0 %
Lymphocytes Absolute: 2.3 x10E3/uL (ref 0.7–3.1)
Lymphs: 22 %
MCH: 28.2 pg (ref 26.6–33.0)
MCHC: 32.2 g/dL (ref 31.5–35.7)
MCV: 88 fL (ref 79–97)
Monocytes Absolute: 0.7 x10E3/uL (ref 0.1–0.9)
Monocytes: 7 %
Neutrophils Absolute: 7.7 x10E3/uL — ABNORMAL HIGH (ref 1.4–7.0)
Neutrophils: 70 %
Platelets: 266 x10E3/uL (ref 150–450)
RBC: 4.5 x10E6/uL (ref 3.77–5.28)
RDW: 14.6 % (ref 11.7–15.4)
RPR Ser Ql: NONREACTIVE
Rh Factor: POSITIVE
Rubella Antibodies, IGG: 2.66 {index} (ref >0.99)
WBC: 10.8 x10E3/uL (ref 3.4–10.8)

## 2024-07-26 LAB — COMPREHENSIVE METABOLIC PANEL WITH GFR
ALT: 10 IU/L (ref 0–32)
AST: 15 IU/L (ref 0–40)
Albumin: 4.7 g/dL (ref 4.0–5.0)
Alkaline Phosphatase: 43 IU/L (ref 41–116)
BUN/Creatinine Ratio: 15 (ref 9–23)
BUN: 9 mg/dL (ref 6–20)
Bilirubin Total: 0.4 mg/dL (ref 0.0–1.2)
CO2: 19 mmol/L — ABNORMAL LOW (ref 20–29)
Calcium: 9.3 mg/dL (ref 8.7–10.2)
Chloride: 103 mmol/L (ref 96–106)
Creatinine, Ser: 0.62 mg/dL (ref 0.57–1.00)
Globulin, Total: 2.3 g/dL (ref 1.5–4.5)
Glucose: 74 mg/dL (ref 70–99)
Potassium: 4 mmol/L (ref 3.5–5.2)
Sodium: 139 mmol/L (ref 134–144)
Total Protein: 7 g/dL (ref 6.0–8.5)
eGFR: 130 mL/min/1.73 (ref >59)

## 2024-07-26 LAB — HEMOGLOBIN A1C
Est. average glucose Bld gHb Est-mCnc: 94 mg/dL
Hgb A1c MFr Bld: 4.9 % (ref 4.8–5.6)

## 2024-07-26 LAB — TSH RFX ON ABNORMAL TO FREE T4: TSH: 0.098 u[IU]/mL — ABNORMAL LOW (ref 0.450–4.500)

## 2024-07-26 LAB — HCV INTERPRETATION

## 2024-07-26 LAB — T4F: T4,Free (Direct): 1.34 ng/dL (ref 0.82–1.77)

## 2024-07-27 ENCOUNTER — Encounter: Payer: Self-pay | Admitting: *Deleted

## 2024-07-27 LAB — GC/CHLAMYDIA PROBE AMP (~~LOC~~) NOT AT ARMC
Chlamydia: NEGATIVE
Comment: NEGATIVE
Comment: NORMAL
Neisseria Gonorrhea: NEGATIVE

## 2024-07-27 LAB — URINE CULTURE, OB REFLEX

## 2024-07-27 LAB — CULTURE, OB URINE

## 2024-07-30 ENCOUNTER — Telehealth: Admitting: Nurse Practitioner

## 2024-07-30 DIAGNOSIS — R112 Nausea with vomiting, unspecified: Secondary | ICD-10-CM | POA: Diagnosis not present

## 2024-07-30 NOTE — Progress Notes (Signed)
 We are sorry that you are not feeling well. Here is how we plan to help!  Based on what you have shared with me it looks like you have a Virus that is irritating your GI tract.  Vomiting is the forceful emptying of a portion of the stomach's content through the mouth.  Although nausea and vomiting can make you feel miserable, it's important to remember that these are not diseases, but rather symptoms of an underlying illness.  When we treat short term symptoms, we always caution that any symptoms that persist should be fully evaluated in a medical office.  I recommend over the counter B6 medication that will help with your symptoms and allow you to stay hydrated:   HOME CARE: Drink clear liquids.  This is very important! Dehydration (the lack of fluid) can lead to a serious complication.  Start off with 1 tablespoon every 5 minutes for 8 hours. You may begin eating bland foods after 8 hours without vomiting.  Start with saltine crackers, white bread, rice, mashed potatoes, applesauce. After 48 hours on a bland diet, you may resume a normal diet. Try to go to sleep.  Sleep often empties the stomach and relieves the need to vomit.  GET HELP RIGHT AWAY IF:  Your symptoms do not improve or worsen within 2 days after treatment. You have a fever for over 3 days. You cannot keep down fluids after trying the medication.  MAKE SURE YOU:  Understand these instructions. Will watch your condition. Will get help right away if you are not doing well or get worse.   Thank you for choosing an e-visit. Your e-visit answers were reviewed by a board certified advanced clinical practitioner to complete your personal care plan. Depending upon the condition, your plan could have included both over the counter or prescription medications. Please review your pharmacy choice. Be sure that the pharmacy you have chosen is open so that you can pick up your prescription now.  If there is a problem you may message  your provider in MyChart to have the prescription routed to another pharmacy. Your safety is important to us . If you have drug allergies check your prescription carefully.  For the next 24 hours, you can use MyChart to ask questions about today's visit, request a non-urgent call back, or ask for a work or school excuse from your e-visit provider. You will get an e-mail in the next two days asking about your experience. I hope that your e-visit has been valuable and will speed your recovery.  I have spent  minutes in review of e-visit questionnaire, review and updating patient chart, medical decision making and response to patient.   Tiffanye Hartmann W Shanicka Oldenkamp, NP

## 2024-07-31 LAB — CYTOLOGY - PAP
Comment: NEGATIVE
Diagnosis: NEGATIVE
High risk HPV: NEGATIVE

## 2024-07-31 LAB — PANORAMA PRENATAL TEST FULL PANEL:PANORAMA TEST PLUS 5 ADDITIONAL MICRODELETIONS: FETAL FRACTION: 5.2

## 2024-08-03 LAB — HORIZON CUSTOM: REPORT SUMMARY: NEGATIVE

## 2024-08-23 ENCOUNTER — Ambulatory Visit: Admitting: Obstetrics and Gynecology

## 2024-08-23 VITALS — BP 103/70 | HR 105 | Wt 165.0 lb

## 2024-08-23 DIAGNOSIS — Z3A14 14 weeks gestation of pregnancy: Secondary | ICD-10-CM

## 2024-08-23 DIAGNOSIS — Z3402 Encounter for supervision of normal first pregnancy, second trimester: Secondary | ICD-10-CM | POA: Diagnosis not present

## 2024-08-23 NOTE — Progress Notes (Unsigned)
 ROB

## 2024-08-23 NOTE — Progress Notes (Unsigned)
 ROB: Pain on left side all the way to breast bone.

## 2024-08-27 NOTE — Progress Notes (Signed)
"  ° °  LOW-RISK PREGNANCY OFFICE VISIT Patient name: Sarah Spears MRN [id number]  Date of birth: 2000-12-20 Chief Complaint:   Routine Prenatal Visit  History of Present Illness:   Sarah Spears is a 23 y.o. G37P0000 female at [redacted]w[redacted]d with an Estimated Date of Delivery: 02/17/25 being seen today for ongoing management of a low-risk pregnancy.  Today she reports no complaints. Contractions: Not present. Vag. Bleeding: None.  Movement: Absent. denies leaking of fluid. Review of Systems:   Pertinent items are noted in HPI Denies abnormal vaginal discharge w/ itching/odor/irritation, headaches, visual changes, shortness of breath, chest pain, abdominal pain, severe nausea/vomiting, or problems with urination or bowel movements unless otherwise stated above. Pertinent History Reviewed:  Reviewed past medical,surgical, social, obstetrical and family history.  Reviewed problem list, medications and allergies. Physical Assessment:   Vitals:   08/23/24 1053  BP: 103/70  Pulse: (!) 105  Weight: 165 lb (74.8 kg)  Body mass index is 30.18 kg/m.        Physical Examination:   General appearance: Well appearing, and in no distress  Mental status: Alert, oriented to person, place, and time  Skin: Warm & dry  Cardiovascular: Normal heart rate noted  Respiratory: Normal respiratory effort, no distress  Abdomen: Soft, gravid, nontender  Pelvic: Cervical exam deferred         Extremities: Edema: None  Fetal Status: Fetal Heart Rate (bpm): 152   Movement: Absent    No results found for this or any previous visit (from the past 24 hours).  Assessment & Plan:  1) Low-risk pregnancy G1P0000 at [redacted]w[redacted]d with an Estimated Date of Delivery: 02/17/25   2) Encounter for supervision of normal first pregnancy in second trimester (Primary) - Normal pregnancy progression - ROB in 4 weeks  3) [redacted] weeks gestation of pregnancy     Meds: No orders of the defined types were placed in this  encounter.  Labs/procedures today: none  Plan:  Continue routine obstetrical care   Reviewed: Preterm labor symptoms and general obstetric precautions including but not limited to vaginal bleeding, contractions, leaking of fluid and fetal movement were reviewed in detail with the patient.  All questions were answered. Has home bp cuff. Check bp weekly, let us  know if >140/90.   Follow-up: Return in about 4 weeks (around 09/20/2024) for Return OB visit.  No orders of the defined types were placed in this encounter.  Tc Kapusta MSN, CNM 08/23/2024 12:36 PM "

## 2024-09-14 ENCOUNTER — Other Ambulatory Visit: Payer: Self-pay | Admitting: *Deleted

## 2024-09-14 ENCOUNTER — Encounter: Payer: Self-pay | Admitting: Obstetrics and Gynecology

## 2024-09-14 MED ORDER — PRENATAL ADULT GUMMY/DHA/FA 0.4-25 MG PO CHEW: 1.0000 | CHEWABLE_TABLET | Freq: Every day | ORAL | 11 refills | Status: AC

## 2024-09-20 ENCOUNTER — Ambulatory Visit (INDEPENDENT_AMBULATORY_CARE_PROVIDER_SITE_OTHER): Admitting: Obstetrics and Gynecology

## 2024-09-20 VITALS — BP 112/75 | HR 88 | Wt 171.0 lb

## 2024-09-20 DIAGNOSIS — Z3A18 18 weeks gestation of pregnancy: Secondary | ICD-10-CM

## 2024-09-20 DIAGNOSIS — F32A Depression, unspecified: Secondary | ICD-10-CM | POA: Diagnosis not present

## 2024-09-20 DIAGNOSIS — O99342 Other mental disorders complicating pregnancy, second trimester: Secondary | ICD-10-CM

## 2024-09-20 DIAGNOSIS — Z3402 Encounter for supervision of normal first pregnancy, second trimester: Secondary | ICD-10-CM
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
## 2024-09-22 LAB — AFP, SERUM, OPEN SPINA BIFIDA
AFP MoM: 0.92
AFP Value: 38.2 ng/mL
Gest. Age on Collection Date: 18.4 wk
Maternal Age At EDD: 22.2 a
OSBR Risk 1 IN: 10000
Test Results:: NEGATIVE
Weight: 171 [lb_av]

## 2024-09-25 ENCOUNTER — Ambulatory Visit: Attending: Obstetrics and Gynecology | Admitting: Maternal & Fetal Medicine

## 2024-09-25 ENCOUNTER — Ambulatory Visit

## 2024-09-25 VITALS — BP 124/75 | HR 86

## 2024-09-25 DIAGNOSIS — Z3A19 19 weeks gestation of pregnancy: Secondary | ICD-10-CM | POA: Diagnosis not present

## 2024-09-25 DIAGNOSIS — O3503X0 Maternal care for (suspected) central nervous system malformation or damage in fetus, choroid plexus cysts, not applicable or unspecified: Secondary | ICD-10-CM | POA: Diagnosis not present

## 2024-09-25 DIAGNOSIS — Z91410 Personal history of adult physical and sexual abuse: Secondary | ICD-10-CM | POA: Insufficient documentation

## 2024-09-25 DIAGNOSIS — O99352 Diseases of the nervous system complicating pregnancy, second trimester: Secondary | ICD-10-CM | POA: Insufficient documentation

## 2024-09-25 DIAGNOSIS — O0990 Supervision of high risk pregnancy, unspecified, unspecified trimester: Secondary | ICD-10-CM | POA: Diagnosis not present

## 2024-09-25 DIAGNOSIS — O99342 Other mental disorders complicating pregnancy, second trimester: Secondary | ICD-10-CM | POA: Insufficient documentation

## 2024-09-25 DIAGNOSIS — F3181 Bipolar II disorder: Secondary | ICD-10-CM | POA: Diagnosis not present

## 2024-09-25 DIAGNOSIS — E669 Obesity, unspecified: Secondary | ICD-10-CM | POA: Diagnosis not present

## 2024-09-25 DIAGNOSIS — Z3689 Encounter for other specified antenatal screening: Secondary | ICD-10-CM | POA: Insufficient documentation

## 2024-09-25 DIAGNOSIS — G930 Cerebral cysts: Secondary | ICD-10-CM | POA: Diagnosis not present

## 2024-09-25 DIAGNOSIS — O99211 Obesity complicating pregnancy, first trimester: Secondary | ICD-10-CM | POA: Diagnosis not present

## 2024-09-25 DIAGNOSIS — Z3A10 10 weeks gestation of pregnancy: Secondary | ICD-10-CM

## 2024-09-25 DIAGNOSIS — Z3401 Encounter for supervision of normal first pregnancy, first trimester: Secondary | ICD-10-CM

## 2024-09-25 DIAGNOSIS — Z3402 Encounter for supervision of normal first pregnancy, second trimester: Secondary | ICD-10-CM
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
## 2024-10-18 ENCOUNTER — Encounter: Admitting: Certified Nurse Midwife

## 2025-02-17 ENCOUNTER — Inpatient Hospital Stay (HOSPITAL_COMMUNITY): Admit: 2025-02-17
